# Patient Record
Sex: Female | Born: 1945 | Race: White | Hispanic: No | Smoking: Former smoker
Health system: Southern US, Community
[De-identification: ages and names within clinical notes are randomized; demographics above are authoritative.]

## PROBLEM LIST (undated history)

## (undated) DIAGNOSIS — N390 Urinary tract infection, site not specified: Secondary | ICD-10-CM

## (undated) DIAGNOSIS — K5792 Diverticulitis of intestine, part unspecified, without perforation or abscess without bleeding: Secondary | ICD-10-CM

## (undated) DIAGNOSIS — F419 Anxiety disorder, unspecified: Secondary | ICD-10-CM

## (undated) HISTORY — PX: CHOLECYSTECTOMY: SHX55

---

## 2008-04-12 ENCOUNTER — Other Ambulatory Visit: Payer: Self-pay

## 2008-04-12 ENCOUNTER — Emergency Department: Payer: Self-pay | Admitting: Emergency Medicine

## 2013-09-08 ENCOUNTER — Emergency Department: Payer: Self-pay | Admitting: Emergency Medicine

## 2013-10-01 ENCOUNTER — Emergency Department: Payer: Self-pay | Admitting: Emergency Medicine

## 2013-10-01 LAB — CBC
MCH: 30.2 pg (ref 26.0–34.0)
Platelet: 439 10*3/uL (ref 150–440)
RBC: 4.58 10*6/uL (ref 3.80–5.20)

## 2013-10-01 LAB — COMPREHENSIVE METABOLIC PANEL
Alkaline Phosphatase: 90 U/L (ref 50–136)
BUN: 10 mg/dL (ref 7–18)
Bilirubin,Total: 0.2 mg/dL (ref 0.2–1.0)
Calcium, Total: 10 mg/dL (ref 8.5–10.1)
Chloride: 104 mmol/L (ref 98–107)
Creatinine: 0.77 mg/dL (ref 0.60–1.30)
EGFR (African American): >60
Potassium: 3.8 mmol/L (ref 3.5–5.1)
Total Protein: 8 g/dL (ref 6.4–8.2)

## 2013-10-01 LAB — URINALYSIS, COMPLETE
Bilirubin,UR: NEGATIVE
Blood: NEGATIVE
Leukocyte Esterase: NEGATIVE
Protein: NEGATIVE
Specific Gravity: 1.002 (ref 1.003–1.030)
WBC UR: NONE SEEN /HPF (ref 0–5)

## 2013-10-02 LAB — LIPASE, BLOOD: Lipase: 220 U/L (ref 73–393)

## 2019-01-16 ENCOUNTER — Ambulatory Visit
Admission: EM | Admit: 2019-01-16 | Discharge: 2019-01-16 | Disposition: A | Discharge status: Home / Self Care | Payer: Medicare HMO | Attending: Family Medicine | Admitting: Family Medicine

## 2019-01-16 ENCOUNTER — Encounter: Payer: Self-pay | Admitting: Emergency Medicine

## 2019-01-16 ENCOUNTER — Other Ambulatory Visit: Payer: Self-pay

## 2019-01-16 DIAGNOSIS — F419 Anxiety disorder, unspecified: Secondary | ICD-10-CM

## 2019-01-16 DIAGNOSIS — Z87891 Personal history of nicotine dependence: Secondary | ICD-10-CM | POA: Diagnosis not present

## 2019-01-16 DIAGNOSIS — E441 Mild protein-calorie malnutrition: Secondary | ICD-10-CM

## 2019-01-16 NOTE — Discharge Instructions (Addendum)
Supplement  your nutrition with boost or Ensure 3 cans daily.  Follow-up with your primary care physician as soon as possible.

## 2019-01-16 NOTE — ED Provider Notes (Signed)
[facility]    CSN: [id number] Arrival date & time: 01/16/19  1840     History   Chief Complaint Chief Complaint  Patient presents with  . Nausea  . Anxiety    HPI Chloe Murray is a 73 y.o. female.   HPI  73 year old female appears very frail and cachectic presents with nausea and anxiety has been going on for a couple of days.  She states she feels extremely weak.  Been seen at the 2 urgent cares over the past 2 weeks diagnosed with sinus infection treated with doxycycline also treated for nausea with Zofran which she states caused her belly to become very distended and bloated.  He was also given additional 15 Xantac because of anxiety.  That according to the [place] substance abuse registry was 105 total this month.  Her main complaints tonight are that of weakness and also of nausea.  States that she eats but is not very hungry and she is trying her best to have fluid intake.  She does not offer any abdominal pain or vomiting.  Appear depressed although she does not believe that that is a problem even though she lost her husband just a year ago.        History reviewed. No pertinent past medical history.  There are no active problems to display for this patient.   History reviewed. No pertinent surgical history.  OB History   No obstetric history on file.      Home Medications    Prior to Admission medications   Medication Sig Start Date End Date Taking? Authorizing Provider  ALPRAZolam Prudy Feeler) 0.25 MG tablet Take by mouth. 01/11/19  Yes [provider]    Family History History reviewed. No pertinent family history.  Social History Social History   Tobacco Use  . Smoking status: Former Games developer  . Smokeless tobacco: Never Used  Substance Use Topics  . Alcohol use: Never    Frequency: Never  . Drug use: Never     Allergies   Citalopram; Sertraline; and Penicillins   Review of Systems Review of Systems    Constitutional: Positive for activity change and appetite change. Negative for chills, fatigue and fever.  Gastrointestinal: Positive for nausea.  Psychiatric/Behavioral: The patient is nervous/anxious.   All other systems reviewed and are negative.    Physical Exam Triage Vital Signs ED Triage Vitals  Enc Vitals Group     BP 01/16/19 1901 129/70     Pulse Rate 01/16/19 1901 77     Resp 01/16/19 1901 16     Temp 01/16/19 1901 97.9 F (36.6 C)     Temp Source 01/16/19 1901 Oral     SpO2 01/16/19 1901 100 %     Weight 01/16/19 1858 100 lb (45.4 kg)     Height 01/16/19 1858 5\' 3"  (1.6 m)     Head Circumference --      Peak Flow --      Pain Score 01/16/19 1858 5     Pain Loc --      Pain Edu? --      Excl. in GC? --    No data found.  Updated Vital Signs BP 129/70 (BP Location: Left Arm)   Pulse 77   Temp 97.9 F (36.6 C) (Oral)   Resp 16   Ht 5\' 3"  (1.6 m)   Wt 100 lb (45.4 kg)   SpO2 100%   BMI 17.71 kg/m   Visual Acuity Right Eye  Distance:   Left Eye Distance:   Bilateral Distance:    Right Eye Near:   Left Eye Near:    Bilateral Near:     Physical Exam Vitals signs and nursing note reviewed.  Constitutional:      General: She is not in acute distress.    Appearance: Normal appearance. She is normal weight. She is not ill-appearing, toxic-appearing or diaphoretic.  HENT:     Head: Normocephalic.     Right Ear: Tympanic membrane, ear canal and external ear normal.     Left Ear: Tympanic membrane, ear canal and external ear normal.     Nose: Nose normal.     Mouth/Throat:     Mouth: Mucous membranes are moist.     Pharynx: Oropharynx is clear. No oropharyngeal exudate or posterior oropharyngeal erythema.  Eyes:     General:        Right eye: No discharge.        Left eye: No discharge.     Conjunctiva/sclera: Conjunctivae normal.  Neck:     Musculoskeletal: Normal range of motion and neck supple.  Pulmonary:     Effort: Pulmonary effort is  normal.     Breath sounds: Normal breath sounds.  Abdominal:     General: Bowel sounds are normal. There is no distension.     Palpations: Abdomen is soft.     Tenderness: There is no abdominal tenderness. There is no guarding or rebound.  Musculoskeletal: Normal range of motion.  Lymphadenopathy:     Cervical: No cervical adenopathy.  Skin:    General: Skin is warm and dry.  Neurological:     General: No focal deficit present.     Mental Status: She is alert and oriented to person, place, and time.  Psychiatric:        Mood and Affect: Mood normal.        Behavior: Behavior normal.        Thought Content: Thought content normal.        Judgment: Judgment normal.      UC Treatments / Results  Labs (all labs ordered are listed, but only abnormal results are displayed) Labs Reviewed - No data to display  EKG None  Radiology No results found.  Procedures Procedures (including critical care time)  Medications Ordered in UC Medications - No data to display  Initial Impression / Assessment and Plan / UC Course  I have reviewed the triage vital signs and the nursing notes.  Pertinent labs & imaging results that were available during my care of the patient were reviewed by me and considered in my medical decision making (see chart for details).   I reviewed the visits at [facility] with her, care that she was given so far and her current problems of weakness and nausea.  Examination today is reassuring as is the laboratory performed at the urgent cares.  Believe the patient has an element of depression also believe that she is calorie and protein malnourished.  Spite the intake that she has recorded along with the fluid intake still is not enough to help her calorie needs.  I recommended that she start drinking Ensure or boost 3 cans daily and supplement in addition to her regular intake of food.  Should also follow-up with her primary care physician as soon as possible for further  evaluation and work-up.  Has a prescription for Zofran but she does have side effects from that.  I am not going to prescribe  any other further antinausea medication this point and hope that by increasing her intake that will solve that problem itself.  She worsens or is not improving and is unable to see her regular physician she should go to the emergency room for a more thorough work-up.   Final Clinical Impressions(s) / UC Diagnoses   Final diagnoses:  Anxiety  Mild protein-calorie malnutrition [facility](HCC)     Discharge Instructions     Supplement  your nutrition with boost or Ensure 3 cans daily.  Follow-up with your primary care physician as soon as possible.    ED Prescriptions    None     Controlled Substance Prescriptions [place] Controlled Substance Registry consulted? Not Applicable   Lutricia FeilRoemer, Analyssa Downs P, PA-C 01/16/19 2103

## 2019-01-16 NOTE — ED Triage Notes (Signed)
Patient c/o nausea and anxiety that has been going on for couple of days.  Patient states that she feels week.  Patient states that she has been seen at 2 UC over the past 2 weeks.  Patient has been on antibiotic and prednisone.

## 2019-01-18 ENCOUNTER — Emergency Department
Admission: EM | Admit: 2019-01-18 | Discharge: 2019-01-18 | Disposition: A | Discharge status: Home / Self Care | Payer: Medicare HMO | Attending: Emergency Medicine | Admitting: Emergency Medicine

## 2019-01-18 ENCOUNTER — Emergency Department: Payer: Medicare HMO

## 2019-01-18 ENCOUNTER — Other Ambulatory Visit: Payer: Self-pay

## 2019-01-18 ENCOUNTER — Encounter: Payer: Self-pay | Admitting: Emergency Medicine

## 2019-01-18 DIAGNOSIS — E860 Dehydration: Secondary | ICD-10-CM | POA: Diagnosis not present

## 2019-01-18 DIAGNOSIS — R1013 Epigastric pain: Secondary | ICD-10-CM

## 2019-01-18 DIAGNOSIS — Z87891 Personal history of nicotine dependence: Secondary | ICD-10-CM | POA: Diagnosis not present

## 2019-01-18 HISTORY — DX: Anxiety disorder, unspecified: F41.9

## 2019-01-18 LAB — URINALYSIS, COMPLETE (UACMP) WITH MICROSCOPIC
BILIRUBIN URINE: NEGATIVE
Bacteria, UA: NONE SEEN
Glucose, UA: NEGATIVE mg/dL
Ketones, ur: NEGATIVE mg/dL
Leukocytes, UA: NEGATIVE
Nitrite: NEGATIVE
Protein, ur: NEGATIVE mg/dL
Specific Gravity, Urine: 1.004 — ABNORMAL LOW (ref 1.005–1.030)
pH: 6 (ref 5.0–8.0)

## 2019-01-18 LAB — BASIC METABOLIC PANEL
Anion gap: 5 (ref 5–15)
BUN: 19 mg/dL (ref 8–23)
CALCIUM: 9.5 mg/dL (ref 8.9–10.3)
CO2: 31 mmol/L (ref 22–32)
CREATININE: 0.63 mg/dL (ref 0.44–1.00)
Chloride: 103 mmol/L (ref 98–111)
GFR calc Af Amer: >60 mL/min (ref >60)
GFR calc non Af Amer: >60 mL/min (ref >60)
Glucose, Bld: 101 mg/dL — ABNORMAL HIGH (ref 70–99)
Potassium: 4.3 mmol/L (ref 3.5–5.1)
SODIUM: 139 mmol/L (ref 135–145)

## 2019-01-18 LAB — CBC
HCT: 40.3 % (ref 36.0–46.0)
Hemoglobin: 13 g/dL (ref 12.0–15.0)
MCH: 29.9 pg (ref 26.0–34.0)
MCHC: 32.3 g/dL (ref 30.0–36.0)
MCV: 92.6 fL (ref 80.0–100.0)
PLATELETS: 398 10*3/uL (ref 150–400)
RBC: 4.35 MIL/uL (ref 3.87–5.11)
RDW: 12.6 % (ref 11.5–15.5)
WBC: 10.2 10*3/uL (ref 4.0–10.5)
nRBC: 0 % (ref 0.0–0.2)

## 2019-01-18 LAB — HEPATIC FUNCTION PANEL
ALT: 28 U/L (ref 0–44)
AST: 26 U/L (ref 15–41)
Albumin: 4.2 g/dL (ref 3.5–5.0)
Alkaline Phosphatase: 45 U/L (ref 38–126)
Total Bilirubin: 0.4 mg/dL (ref 0.3–1.2)
Total Protein: 6.8 g/dL (ref 6.5–8.1)

## 2019-01-18 LAB — GLUCOSE, CAPILLARY: Glucose-Capillary: 92 mg/dL (ref 70–99)

## 2019-01-18 LAB — TSH: TSH: 1.281 u[IU]/mL (ref 0.350–4.500)

## 2019-01-18 LAB — ACETAMINOPHEN LEVEL: Acetaminophen (Tylenol), Serum: <10 ug/mL — ABNORMAL LOW (ref 10–30)

## 2019-01-18 LAB — T4, FREE: Free T4: 1.1 ng/dL (ref 0.82–1.77)

## 2019-01-18 LAB — PHOSPHORUS: Phosphorus: 3.9 mg/dL (ref 2.5–4.6)

## 2019-01-18 LAB — MAGNESIUM: Magnesium: 2.2 mg/dL (ref 1.7–2.4)

## 2019-01-18 LAB — LIPASE, BLOOD: Lipase: 61 U/L — ABNORMAL HIGH (ref 11–51)

## 2019-01-18 MED ORDER — IOHEXOL 300 MG/ML  SOLN
75.0000 mL | Freq: Once | INTRAMUSCULAR | Status: AC | PRN
  Administered 2019-01-18: 75 mL via INTRAVENOUS

## 2019-01-18 MED ORDER — SODIUM CHLORIDE 0.9 % IV BOLUS
1000.0000 mL | Freq: Once | INTRAVENOUS | Status: AC
  Administered 2019-01-18: 1000 mL via INTRAVENOUS

## 2019-01-18 MED ORDER — FAMOTIDINE 20 MG PO TABS: 20.0000 mg | ORAL_TABLET | Freq: Two times a day (BID) | ORAL | 0 refills | Status: AC

## 2019-01-18 MED ORDER — ALUMINUM-MAGNESIUM-SIMETHICONE 200-200-20 MG/5ML PO SUSP: 30.0000 mL | Freq: Three times a day (TID) | ORAL | 0 refills | Status: DC

## 2019-01-18 NOTE — ED Triage Notes (Signed)
Pt in via POV with multiple complaints, states, "I have been sick since the first weekend of the month, I was seen at [facility] and given antibiotics and nausea medicine without any relief."  Pt reports generalized weakness, continued nausea, reports diarrhea has resolved but states she is now seeing blood from rectum.  Vitals WDL, NAD noted at this time.

## 2019-01-18 NOTE — ED Provider Notes (Signed)
[facility] Emergency Department Provider Note  ____________________________________________  Time seen: Approximately 9:19 PM  I have reviewed the triage vital signs and the nursing notes.   HISTORY  Chief Complaint Weakness    HPI Chloe Murray is a 73 y.o. female with a history of anxiety who complains of upper abdominal pain for the past month.  Has been seen at [facility] urgent care 2 or 3 times.  Initially was on a prescription of doxycycline and Zofran for URI/sinusitis, reports that her symptoms overall have not improved and she still feels weak.  She has a loss of appetite, but ate 2 meals today and drinks Gatorade.  She has epigastric pain which is nonradiating.  No aggravating or alleviating factors.  Moderate intensity.  Intermittent.      Past Medical History:  Diagnosis Date  . Anxiety      There are no active problems to display for this patient.    History reviewed. No pertinent surgical history.   Prior to Admission medications   Medication Sig Start Date End Date Taking? Authorizing Provider  ALPRAZolam Prudy Feeler) 0.25 MG tablet Take by mouth. 01/11/19   [provider]  aluminum-magnesium hydroxide-simethicone (MAALOX) 200-200-20 MG/5ML SUSP Take 30 mLs by mouth 4 (four) times daily -  before meals and at bedtime. 01/18/19   Sharman Cheek, MD  famotidine (PEPCID) 20 MG tablet Take 1 tablet (20 mg total) by mouth 2 (two) times daily. 01/18/19   Sharman Cheek, MD     Allergies Sertraline; Citalopram; and Penicillins   No family history on file.  Social History Social History   Tobacco Use  . Smoking status: Former Games developer  . Smokeless tobacco: Never Used  Substance Use Topics  . Alcohol use: Never    Frequency: Never  . Drug use: Never    Review of Systems  Constitutional:   No fever or chills.  ENT:   No sore throat. No rhinorrhea. Cardiovascular:   No chest pain or syncope. Respiratory:   No dyspnea or  cough. Gastrointestinal:   Positive as above for abdominal pain without vomiting constipation and diarrhea.  Musculoskeletal:   Negative for focal pain or swelling All other systems reviewed and are negative except as documented above in ROS and HPI.  ____________________________________________   PHYSICAL EXAM:  VITAL SIGNS: ED Triage Vitals  Enc Vitals Group     BP 01/18/19 1653 120/63     Pulse Rate 01/18/19 1653 97     Resp 01/18/19 1653 (!) 21     Temp 01/18/19 1653 (!) 97.5 F (36.4 C)     Temp Source 01/18/19 1653 Oral     SpO2 01/18/19 1653 100 %     Weight 01/18/19 1654 100 lb (45.4 kg)     Height 01/18/19 1654 5\' 3"  (1.6 m)     Head Circumference --      Peak Flow --      Pain Score 01/18/19 1653 6     Pain Loc --      Pain Edu? --      Excl. in GC? --     Vital signs reviewed, nursing assessments reviewed.   Constitutional:   Alert and oriented. Non-toxic appearance. Eyes:   Conjunctivae are normal. EOMI. PERRL. ENT      Head:   Normocephalic and atraumatic.      Nose:   No congestion/rhinnorhea.       Mouth/Throat:   MMM, no pharyngeal erythema. No peritonsillar mass.  Neck:   No meningismus. Full ROM. Hematological/Lymphatic/Immunilogical:   No cervical lymphadenopathy. Cardiovascular:   RRR. Symmetric bilateral radial and DP pulses.  No murmurs. Cap refill less than 2 seconds. Respiratory:   Normal respiratory effort without tachypnea/retractions. Breath sounds are clear and equal bilaterally. No wheezes/rales/rhonchi. Gastrointestinal:   Soft with mild epigastric tenderness. Non distended. There is no CVA tenderness.  No rebound, rigidity, or guarding. Musculoskeletal:   Normal range of motion in all extremities. No joint effusions.  No lower extremity tenderness.  No edema. Neurologic:   Normal speech and language.  Motor grossly intact. No acute focal neurologic deficits are appreciated.  Skin:    Skin is warm, dry and intact. No rash noted.  No  petechiae, purpura, or bullae.  ____________________________________________    LABS (pertinent positives/negatives) (all labs ordered are listed, but only abnormal results are displayed) Labs Reviewed  BASIC METABOLIC PANEL - Abnormal; Notable for the following components:      Result Value   Glucose, Bld 101 (*)    All other components within normal limits  URINALYSIS, COMPLETE (UACMP) WITH MICROSCOPIC - Abnormal; Notable for the following components:   Color, Urine COLORLESS (*)    APPearance CLEAR (*)    Specific Gravity, Urine 1.004 (*)    Hgb urine dipstick SMALL (*)    All other components within normal limits  LIPASE, BLOOD - Abnormal; Notable for the following components:   Lipase 61 (*)    All other components within normal limits  ACETAMINOPHEN LEVEL - Abnormal; Notable for the following components:   Acetaminophen (Tylenol), Serum <10 (*)    All other components within normal limits  CBC  GLUCOSE, CAPILLARY  HEPATIC FUNCTION PANEL  MAGNESIUM  PHOSPHORUS  TSH  T4, FREE  CBG MONITORING, ED   ____________________________________________   EKG    ____________________________________________    RADIOLOGY  Ct Abdomen Pelvis W Contrast  Result Date: 01/18/2019 CLINICAL DATA:  73 year old female with acute abdominal and pelvic pain with nausea. EXAM: CT ABDOMEN AND PELVIS WITH CONTRAST TECHNIQUE: Multidetector CT imaging of the abdomen and pelvis was performed using the standard protocol following bolus administration of intravenous contrast. CONTRAST:  75mL OMNIPAQUE IOHEXOL 300 MG/ML  SOLN COMPARISON:  10/02/2013 CT FINDINGS: Lower chest: No acute abnormality Hepatobiliary: The liver is unremarkable. The patient is status post cholecystectomy. No biliary dilatation. Pancreas: Unremarkable Spleen: Unremarkable Adrenals/Urinary Tract: Kidneys, adrenal glands and bladder are unremarkable. Stomach/Bowel: Stomach is within normal limits. No evidence of bowel wall  thickening, distention, or inflammatory changes. Colonic diverticulosis noted without evidence of diverticulitis. Vascular/Lymphatic: Aortic atherosclerosis. No enlarged abdominal or pelvic lymph nodes. Reproductive: Status post hysterectomy. No adnexal masses. Other: No ascites, focal collection/abscess or pneumoperitoneum. Musculoskeletal: No acute or suspicious bony abnormalities. IMPRESSION: 1. No evidence of acute abnormality 2.  Aortic Atherosclerosis (ICD10-I70.0). Electronically Signed   By: Harmon PierJeffrey  Hu M.D.   On: 01/18/2019 19:09    ____________________________________________   PROCEDURES Procedures  ____________________________________________  DIFFERENTIAL DIAGNOSIS   Pancreatitis, cholecystitis, gastritis, bowel perforation, AAA, colitis/IBD  CLINICAL IMPRESSION / ASSESSMENT AND PLAN / ED COURSE  Pertinent labs & imaging results that were available during my care of the patient were reviewed by me and considered in my medical decision making (see chart for details).    Patient is nontoxic, presents with subacute to chronic upper abdominal pain.  Appears mildly dehydrated on exam.  Vital signs are unremarkable.  Labs including LFTs and TFTs are unremarkable.  CT scan obtained due to age  and chronicity, which was unremarkable.  Patient sitting upright and ambulatory, eager to go home so she can take her Xanax.  Recommended she do a trial of antacid therapy and follow-up with her doctor within a week.  Return precautions for worsening symptoms.      ____________________________________________   FINAL CLINICAL IMPRESSION(S) / ED DIAGNOSES    Final diagnoses:  Epigastric pain  Mild dehydration     ED Discharge Orders         Ordered    famotidine (PEPCID) 20 MG tablet  2 times daily     01/18/19 2118    aluminum-magnesium hydroxide-simethicone (MAALOX) 200-200-20 MG/5ML SUSP  3 times daily before meals & bedtime     01/18/19 2118          Portions of this  note were generated with dragon dictation software. Dictation errors may occur despite best attempts at proofreading.   Sharman CheekStafford, Jailani Hogans, MD 01/18/19 2122

## 2019-01-18 NOTE — Discharge Instructions (Signed)
Your CT scan and lab test today were all okay.  Please take medicines to calm your stomach, drink lots of fluids to stay hydrated, and follow-up with your doctor within a week.

## 2019-01-20 ENCOUNTER — Emergency Department: Payer: Medicare HMO

## 2019-01-20 ENCOUNTER — Other Ambulatory Visit: Payer: Self-pay

## 2019-01-20 ENCOUNTER — Emergency Department
Admission: EM | Admit: 2019-01-20 | Discharge: 2019-01-20 | Disposition: A | Discharge status: Home / Self Care | Payer: Medicare HMO | Attending: Emergency Medicine | Admitting: Emergency Medicine

## 2019-01-20 ENCOUNTER — Encounter: Payer: Self-pay | Admitting: Emergency Medicine

## 2019-01-20 DIAGNOSIS — J069 Acute upper respiratory infection, unspecified: Secondary | ICD-10-CM | POA: Diagnosis not present

## 2019-01-20 DIAGNOSIS — Z87891 Personal history of nicotine dependence: Secondary | ICD-10-CM | POA: Diagnosis not present

## 2019-01-20 DIAGNOSIS — F419 Anxiety disorder, unspecified: Secondary | ICD-10-CM | POA: Diagnosis not present

## 2019-01-20 DIAGNOSIS — J3489 Other specified disorders of nose and nasal sinuses: Secondary | ICD-10-CM | POA: Diagnosis present

## 2019-01-20 LAB — CBC
HCT: 39.9 % (ref 36.0–46.0)
Hemoglobin: 12.9 g/dL (ref 12.0–15.0)
MCH: 29.8 pg (ref 26.0–34.0)
MCHC: 32.3 g/dL (ref 30.0–36.0)
MCV: 92.1 fL (ref 80.0–100.0)
Platelets: 402 10*3/uL — ABNORMAL HIGH (ref 150–400)
RBC: 4.33 MIL/uL (ref 3.87–5.11)
RDW: 12.7 % (ref 11.5–15.5)
WBC: 11.5 10*3/uL — ABNORMAL HIGH (ref 4.0–10.5)
nRBC: 0 % (ref 0.0–0.2)

## 2019-01-20 LAB — COMPREHENSIVE METABOLIC PANEL
ALT: 32 U/L (ref 0–44)
AST: 28 U/L (ref 15–41)
Albumin: 4 g/dL (ref 3.5–5.0)
Alkaline Phosphatase: 40 U/L (ref 38–126)
Anion gap: 7 (ref 5–15)
BUN: 15 mg/dL (ref 8–23)
CO2: 29 mmol/L (ref 22–32)
Calcium: 9.1 mg/dL (ref 8.9–10.3)
Chloride: 101 mmol/L (ref 98–111)
Creatinine, Ser: 0.67 mg/dL (ref 0.44–1.00)
GFR calc Af Amer: >60 mL/min (ref >60)
GFR calc non Af Amer: >60 mL/min (ref >60)
Glucose, Bld: 86 mg/dL (ref 70–99)
POTASSIUM: 4 mmol/L (ref 3.5–5.1)
Sodium: 137 mmol/L (ref 135–145)
Total Bilirubin: 0.4 mg/dL (ref 0.3–1.2)
Total Protein: 7.2 g/dL (ref 6.5–8.1)

## 2019-01-20 LAB — LIPASE, BLOOD: Lipase: 51 U/L (ref 11–51)

## 2019-01-20 MED ORDER — SODIUM CHLORIDE 0.9% FLUSH: 3.0000 mL | Freq: Once | INTRAVENOUS | Status: DC

## 2019-01-20 MED ORDER — LACTINEX PO CHEW: 1.0000 | CHEWABLE_TABLET | Freq: Three times a day (TID) | ORAL | 0 refills | Status: AC

## 2019-01-20 MED ORDER — FLUTICASONE PROPIONATE 50 MCG/ACT NA SUSP: 2.0000 | Freq: Every day | NASAL | 0 refills | Status: DC

## 2019-01-20 MED ORDER — PSEUDOEPHEDRINE HCL 60 MG PO TABS: 60.0000 mg | ORAL_TABLET | Freq: Four times a day (QID) | ORAL | 0 refills | Status: DC | PRN

## 2019-01-20 MED ORDER — SODIUM CHLORIDE 0.9 % IV BOLUS
500.0000 mL | Freq: Once | INTRAVENOUS | Status: AC
  Administered 2019-01-20: 500 mL via INTRAVENOUS

## 2019-01-20 NOTE — ED Notes (Signed)
Patient ambulated to room commode with a steady gait. Patient transported to CT scan.

## 2019-01-20 NOTE — ED Triage Notes (Signed)
Pt to ED via POV. Pt states that she was seen 2 days ago for diarrhea and was told to follow up at [facility]. Pt states that she has still been having diarrhea. She went to [facility] this morning and they sent her back here for possible dehydration. Pt is in NAD at this time.

## 2019-01-20 NOTE — ED Notes (Signed)
Patient is concerned that her sinus infection and right ear infection is coming back and causing her to feel weak. Patient states diarrhea is resolving with Pepto-bismol.

## 2019-01-20 NOTE — Discharge Instructions (Addendum)
Take the nasal spray and decongestant over the next several days as prescribed to help with your symptoms.  You can continue the Pepcid and other medications you have been taking for your GI symptoms, and you should start taking the lactobacillus as well to help regrow good bacteria in your intestine.  Return to the ER for new or worsening congestion, ear pain, lightheadedness, weakness, vomiting or diarrhea, or any other new or worsening symptoms that concern you.  Make an appointment to follow-up with your primary care doctor as soon as possible.  We will fax him a note letting him know you were here.

## 2019-01-20 NOTE — ED Provider Notes (Signed)
[facility] Emergency Department Provider Note ____________________________________________   First MD Initiated Contact with Patient 01/20/19 1531     (approximate)  I have reviewed the triage vital signs and the nursing notes.   HISTORY  Chief Complaint Diarrhea    HPI Chloe Murray is a 73 y.o. female with PMH as noted below who presents with 2 primary complaints.  She states that she has persistent sinus pressure, right ear pain, and congestion over the last several weeks associated with generalized weakness and malaise.  She also reports some diarrhea over the last several days and states she went to urgent care and was told to come to the hospital because of concern that she could be dehydrated.  The patient denies any fever, vomiting, focal abdominal pain, or urinary symptoms.  She was [facility] times and got a course of prednisone as well as a course of doxycycline which improved the symptoms somewhat.  She was also seen here in the ED 2 days ago.  Past Medical History:  Diagnosis Date  . Anxiety     There are no active problems to display for this patient.   History reviewed. No pertinent surgical history.  Prior to Admission medications   Medication Sig Start Date End Date Taking? Authorizing Provider  ALPRAZolam Prudy Feeler(XANAX) 0.25 MG tablet Take by mouth. 01/11/19   [provider]  aluminum-magnesium hydroxide-simethicone (MAALOX) 200-200-20 MG/5ML SUSP Take 30 mLs by mouth 4 (four) times daily -  before meals and at bedtime. 01/18/19   Sharman CheekStafford, Phillip, MD  famotidine (PEPCID) 20 MG tablet Take 1 tablet (20 mg total) by mouth 2 (two) times daily. 01/18/19   Sharman CheekStafford, Phillip, MD  fluticasone [facility](FLONASE) 50 MCG/ACT nasal spray Place 2 sprays into both nostrils daily. 01/20/19   Dionne BucySiadecki, Lacretia Tindall, MD  lactobacillus acidophilus & bulgar (LACTINEX) chewable tablet Chew 1 tablet by mouth 3 (three) times daily with  meals for 7 days. 01/20/19 01/27/19  Dionne BucySiadecki, Elden Brucato, MD  pseudoephedrine (SUDAFED) 60 MG tablet Take 1 tablet (60 mg total) by mouth every 6 (six) hours as needed for congestion. 01/20/19   Dionne BucySiadecki, Tahra Hitzeman, MD    Allergies Sertraline; Citalopram; and Penicillins  No family history on file.  Social History Social History   Tobacco Use  . Smoking status: Former Games developermoker  . Smokeless tobacco: Never Used  Substance Use Topics  . Alcohol use: Never    Frequency: Never  . Drug use: Never    Review of Systems  Constitutional: No fever. Eyes: No visual changes. ENT: Positive for congestion and sinus pressure. Cardiovascular: Denies chest pain. Respiratory: Denies shortness of breath. Gastrointestinal: Positive for diarrhea. Genitourinary: Negative for dysuria.  Musculoskeletal: Negative for back pain. Skin: Negative for rash. Neurological: Negative for headache.  ____________________________________________   PHYSICAL EXAM:  VITAL SIGNS: ED Triage Vitals  Enc Vitals Group     BP 01/20/19 1253 (!) 124/49     Pulse Rate 01/20/19 1253 68     Resp 01/20/19 1253 16     Temp 01/20/19 1253 98.1 F (36.7 C)     Temp Source 01/20/19 1253 Oral     SpO2 01/20/19 1253 99 %     Weight 01/20/19 1251 101 lb 12.8 oz (46.2 kg)     Height 01/20/19 1251 5\' 3"  (1.6 m)     Head Circumference --      Peak Flow --      Pain Score 01/20/19 1250 4  Pain Loc --      Pain Edu? --      Excl. in GC? --     Constitutional: Alert and oriented.  Relatively well appearing and in no acute distress. Eyes: Conjunctivae are normal.  EOMI.  PERRLA. Head: Atraumatic. Nose: No congestion/rhinnorhea. Mouth/Throat: Mucous membranes are somewhat dry.   Neck: Normal range of motion.  Cardiovascular: Good peripheral circulation. Respiratory: Normal respiratory effort. Lungs CTAB. Gastrointestinal: Soft and nontender. No distention.  Musculoskeletal: Extremities warm and well perfused.    Neurologic:  Normal speech and language. No gross focal neurologic deficits are appreciated.  Skin:  Skin is warm and dry. No rash noted. Psychiatric: Mood and affect are normal. Speech and behavior are normal.  ____________________________________________   LABS (all labs ordered are listed, but only abnormal results are displayed)  Labs Reviewed  CBC - Abnormal; Notable for the following components:      Result Value   WBC 11.5 (*)    Platelets 402 (*)    All other components within normal limits  LIPASE, BLOOD  COMPREHENSIVE METABOLIC PANEL   ____________________________________________  EKG   ____________________________________________  RADIOLOGY  CT maxillofacial: No evidence of sinusitis  ____________________________________________   PROCEDURES  Procedure(s) performed: No  Procedures  Critical Care performed: No ____________________________________________   INITIAL IMPRESSION / ASSESSMENT AND PLAN / ED COURSE  Pertinent labs & imaging results that were available during my care of the patient were reviewed by me and considered in my medical decision making (see chart for details).  73 year old female with PMH as noted above presents with persistent sinus pressure, congestion, and right ear discomfort over several weeks, as well as with diarrhea and concerned that she could be dehydrated.  Incidentally, the patient also mentions that she has been feeling depressed.  She states that she used to be on antidepressants but has not been on one for some time.  She denies any SI or HI, or feelings of hopelessness.  She states that she does not need to speak to a psychiatrist today but just wanted to let me know because a doctor had asked her about this previously and wanted to make sure I knew any relevant information.  I reviewed the past medical records in Epic.  The patient was originally seen in [facility] urgent care on 1/16 with URI and sinusitis type symptoms as  well as diarrhea and was started on doxycycline and Zofran.  She was negative for influenza and C. difficile.  She returned on 1/23 with fatigue and nausea.  She was given a course of prednisone.  The patient was then seen in our urgent care on 1/28 with concern for persistent weakness and was started on Ensure to help her caloric intake.  She was then seen in the ED 2 days ago and had CT abdomen which was negative.  Overall I suspect that the patient most likely has a viral upper respiratory infection or sinusitis which precipitated IBS exacerbation and likely some symptoms related to the antibiotic course.  I would like to minimize additional medications.  Lab work-up is reassuring.  The patient does appear mildly dehydrated clinically.  I will give a fluid bolus, and obtain a CT of the sinuses.  If this is negative I will recommend symptomatic treatment only.  If she does have evidence of sinusitis with symptoms having gone on for several weeks I may consider an alternate antibiotic versus outpatient ENT follow-up.  ----------------------------------------- 6:51 PM on 01/20/2019 -----------------------------------------  CT was negative  for evidence of sinusitis.  Therefore, I will prescribe the patient a nasal spray and decongestant for symptomatic treatment.  I will also prescribe lactobacillus for her GI symptoms after having been on an antibiotic.  The patient feels comfortable and would like to go home.  I counseled her on the results of the work-up and the plan of care, and she expresses agreement.  Return precautions given and she expressed understanding.  In terms of the depression, there is no evidence of danger to self or others and no indication for emergent psychiatric consultation in the ED.  The patient states she does not need to speak to a psychiatrist right now.  She would like to follow-up with her primary care doctor for  this. ____________________________________________   FINAL CLINICAL IMPRESSION(S) / ED DIAGNOSES  Final diagnoses:  Viral upper respiratory illness      NEW MEDICATIONS STARTED DURING THIS VISIT:  New Prescriptions   FLUTICASONE (FLONASE) 50 MCG/ACT NASAL SPRAY    Place 2 sprays into both nostrils daily.   LACTOBACILLUS ACIDOPHILUS & BULGAR (LACTINEX) CHEWABLE TABLET    Chew 1 tablet by mouth 3 (three) times daily with meals for 7 days.   PSEUDOEPHEDRINE (SUDAFED) 60 MG TABLET    Take 1 tablet (60 mg total) by mouth every 6 (six) hours as needed for congestion.     Note:  This document was prepared using Dragon voice recognition software and may include unintentional dictation errors.    Dionne BucySiadecki, Leata Dominy, MD 01/20/19 [phone]

## 2020-07-06 ENCOUNTER — Other Ambulatory Visit: Payer: Self-pay

## 2020-07-06 ENCOUNTER — Emergency Department: Payer: Medicare HMO

## 2020-07-06 ENCOUNTER — Emergency Department
Admission: EM | Admit: 2020-07-06 | Discharge: 2020-07-06 | Disposition: A | Discharge status: Home / Self Care | Payer: Medicare HMO | Attending: Emergency Medicine | Admitting: Emergency Medicine

## 2020-07-06 DIAGNOSIS — K2100 Gastro-esophageal reflux disease with esophagitis, without bleeding: Secondary | ICD-10-CM | POA: Diagnosis not present

## 2020-07-06 DIAGNOSIS — Z87891 Personal history of nicotine dependence: Secondary | ICD-10-CM | POA: Insufficient documentation

## 2020-07-06 DIAGNOSIS — F419 Anxiety disorder, unspecified: Secondary | ICD-10-CM

## 2020-07-06 DIAGNOSIS — R1013 Epigastric pain: Secondary | ICD-10-CM | POA: Diagnosis present

## 2020-07-06 DIAGNOSIS — R079 Chest pain, unspecified: Secondary | ICD-10-CM

## 2020-07-06 LAB — BASIC METABOLIC PANEL
Anion gap: 7 (ref 5–15)
BUN: 14 mg/dL (ref 8–23)
CO2: 27 mmol/L (ref 22–32)
Calcium: 8.8 mg/dL — ABNORMAL LOW (ref 8.9–10.3)
Chloride: 105 mmol/L (ref 98–111)
Creatinine, Ser: 0.8 mg/dL (ref 0.44–1.00)
GFR calc Af Amer: >60 mL/min (ref >60)
GFR calc non Af Amer: >60 mL/min (ref >60)
Glucose, Bld: 118 mg/dL — ABNORMAL HIGH (ref 70–99)
Potassium: 3.1 mmol/L — ABNORMAL LOW (ref 3.5–5.1)
Sodium: 139 mmol/L (ref 135–145)

## 2020-07-06 LAB — TROPONIN I (HIGH SENSITIVITY)
Troponin I (High Sensitivity): 4 ng/L (ref <18)
Troponin I (High Sensitivity): 3 ng/L (ref <18)

## 2020-07-06 LAB — CBC
HCT: 38.2 % (ref 36.0–46.0)
Hemoglobin: 12.5 g/dL (ref 12.0–15.0)
MCH: 29.1 pg (ref 26.0–34.0)
MCHC: 32.7 g/dL (ref 30.0–36.0)
MCV: 88.8 fL (ref 80.0–100.0)
Platelets: 348 10*3/uL (ref 150–400)
RBC: 4.3 MIL/uL (ref 3.87–5.11)
RDW: 13.2 % (ref 11.5–15.5)
WBC: 13 10*3/uL — ABNORMAL HIGH (ref 4.0–10.5)
nRBC: 0 % (ref 0.0–0.2)

## 2020-07-06 MED ORDER — PANTOPRAZOLE SODIUM 40 MG PO TBEC
40.0000 mg | DELAYED_RELEASE_TABLET | Freq: Every day | ORAL | Status: DC
  Administered 2020-07-06: 40 mg via ORAL
  Filled 2020-07-06: qty 1

## 2020-07-06 MED ORDER — OMEPRAZOLE 10 MG PO CPDR: 10.0000 mg | DELAYED_RELEASE_CAPSULE | Freq: Every day | ORAL | 3 refills | Status: DC

## 2020-07-06 MED ORDER — ALUM & MAG HYDROXIDE-SIMETH 200-200-20 MG/5ML PO SUSP
30.0000 mL | Freq: Once | ORAL | Status: AC
  Administered 2020-07-06: 30 mL via ORAL
  Filled 2020-07-06: qty 30

## 2020-07-06 MED ORDER — SODIUM CHLORIDE 0.9% FLUSH: 3.0000 mL | Freq: Once | INTRAVENOUS | Status: DC

## 2020-07-06 MED ORDER — LIDOCAINE VISCOUS HCL 2 % MT SOLN
15.0000 mL | Freq: Once | OROMUCOSAL | Status: AC
  Administered 2020-07-06: 15 mL via ORAL
  Filled 2020-07-06: qty 15

## 2020-07-06 NOTE — ED Triage Notes (Signed)
Pt comes via POV from home with c/o diarrhea and belly pain for the last few days. Pt states that today she started to have left sided CP. Pt states she wasn't sure if she was having a panic attack so she took her prescribed medication.  Pt states pain has subsided some. Pt states pain radiates to back.

## 2020-07-06 NOTE — ED Provider Notes (Signed)
[facility] Emergency Department Provider Note  ____________________________________________  Time seen: Approximately 6:07 PM  I have reviewed the triage vital signs and the nursing notes.   HISTORY  Chief Complaint Chest Pain    HPI Chloe Murray is a 74 y.o. female who presents the emergency department complaining of epigastric and chest pain.  Patient states that she has been having normal IBS symptoms over the past several days.  She has had increased reflux symptoms as well.  Today she had pain that extended from the epigastric region into her chest.  She does have a history of anxiety and panic attacks and states that she thought symptoms may be similar.  She took her prescribed Xanax and when this did not immediately resolve symptoms she presented to the emergency department for evaluation.  No history of cardiac complaints to include arrhythmias or previous STEMI, blockages.  Patient states that while she was in the emergency department waiting to be seen the symptoms did resolve.  She states that she believes that it is likely her reflux and really would like to discuss being placed on omeprazole.  She has had no emesis, diarrhea or constipation.  No urinary complaints.         Past Medical History:  Diagnosis Date  . Anxiety     There are no problems to display for this patient.   History reviewed. No pertinent surgical history.  Prior to Admission medications   Medication Sig Start Date End Date Taking? Authorizing Provider  ALPRAZolam Prudy Feeler) 0.25 MG tablet Take by mouth. 01/11/19   [provider]  aluminum-magnesium hydroxide-simethicone (MAALOX) 200-200-20 MG/5ML SUSP Take 30 mLs by mouth 4 (four) times daily -  before meals and at bedtime. 01/18/19   Sharman Cheek, MD  famotidine (PEPCID) 20 MG tablet Take 1 tablet (20 mg total) by mouth 2 (two) times daily. 01/18/19   Sharman Cheek, MD  fluticasone [facility]) 50 MCG/ACT nasal  spray Place 2 sprays into both nostrils daily. 01/20/19   Dionne Bucy, MD  pseudoephedrine (SUDAFED) 60 MG tablet Take 1 tablet (60 mg total) by mouth every 6 (six) hours as needed for congestion. 01/20/19   Dionne Bucy, MD    Allergies Sertraline, Citalopram, and Penicillins  No family history on file.  Social History Social History   Tobacco Use  . Smoking status: Former Games developer  . Smokeless tobacco: Never Used  Vaping Use  . Vaping Use: Never used  Substance Use Topics  . Alcohol use: Never  . Drug use: Never     Review of Systems  Constitutional: No fever/chills Eyes: No visual changes. No discharge ENT: No upper respiratory complaints. Cardiovascular: Positive chest pain. Respiratory: no cough. No SOB. Gastrointestinal: Epigastric abdominal pain.  No nausea, no vomiting.  No diarrhea.  No constipation. Genitourinary: Negative for dysuria. No hematuria Musculoskeletal: Negative for musculoskeletal pain. Skin: Negative for rash, abrasions, lacerations, ecchymosis. Neurological: Negative for headaches, focal weakness or numbness. 10-point ROS otherwise negative.  ____________________________________________   PHYSICAL EXAM:  VITAL SIGNS: ED Triage Vitals  Enc Vitals Group     BP 07/06/20 1222 (!) 127/50     Pulse Rate 07/06/20 1222 77     Resp 07/06/20 1222 18     Temp 07/06/20 1222 97.9 F (36.6 C)     Temp Source 07/06/20 1708 Oral     SpO2 07/06/20 1222 98 %     Weight 07/06/20 1222 115 lb (52.2 kg)     Height 07/06/20 1222  5\' 3"  (1.6 m)     Head Circumference --      Peak Flow --      Pain Score 07/06/20 1221 7     Pain Loc --      Pain Edu? --      Excl. in GC? --      Constitutional: Alert and oriented. Well appearing and in no acute distress. Eyes: Conjunctivae are normal. PERRL. EOMI. Head: Atraumatic. ENT:      Ears:       Nose: No congestion/rhinnorhea.      Mouth/Throat: Mucous membranes are moist.  Neck: No stridor.   Hematological/Lymphatic/Immunilogical: No cervical lymphadenopathy. Cardiovascular: Normal rate, regular rhythm. Normal S1 and S2.  No murmurs, rubs, gallops.  No apical heave.  Good peripheral circulation. Respiratory: Normal respiratory effort without tachypnea or retractions. Lungs CTAB. Good air entry to the bases with no decreased or absent breath sounds. Gastrointestinal: Bowel sounds 4 quadrants. Soft and nontender to palpation. No guarding or rigidity. No palpable masses. No distention. No CVA tenderness. Musculoskeletal: Full range of motion to all extremities. No gross deformities appreciated. Neurologic:  Normal speech and language. No gross focal neurologic deficits are appreciated.  Skin:  Skin is warm, dry and intact. No rash noted. Psychiatric: Mood and affect are normal. Speech and behavior are normal. Patient exhibits appropriate insight and judgement.   ____________________________________________   LABS (all labs ordered are listed, but only abnormal results are displayed)  Labs Reviewed  BASIC METABOLIC PANEL - Abnormal; Notable for the following components:      Result Value   Potassium 3.1 (*)    Glucose, Bld 118 (*)    Calcium 8.8 (*)    All other components within normal limits  CBC - Abnormal; Notable for the following components:   WBC 13.0 (*)    All other components within normal limits  TROPONIN I (HIGH SENSITIVITY)  TROPONIN I (HIGH SENSITIVITY)   ____________________________________________  EKG  ED ECG REPORT I, 07/08/20 Mikal Blasdell,  personally viewed and interpreted this ECG.   Date: 07/06/2020  EKG Time: 1216 hrs.  Rate: 75 bpm  Rhythm: normal EKG, normal sinus rhythm, unchanged from previous tracings  Axis: Normal axis  Intervals:none  ST&T Change: No ST elevation or depression noted  Normal sinus rhythm.  No STEMI.  ____________________________________________  RADIOLOGY I personally viewed and evaluated these images as part of  my medical decision making, as well as reviewing the written report by the radiologist.  DG Chest 2 View  Result Date: 07/06/2020 CLINICAL DATA:  Abdominal pain and diarrhea for a few days. EXAM: CHEST - 2 VIEW COMPARISON:  None. FINDINGS: Lungs clear. Heart size normal. Aortic atherosclerosis. No pneumothorax or pleural fluid. No acute or focal bony abnormality. IMPRESSION: No acute disease. Aortic Atherosclerosis (ICD10-I70.0). Electronically Signed   By: 07/08/2020 M.D.   On: 07/06/2020 13:05    ____________________________________________    PROCEDURES  Procedure(s) performed:    Procedures    Medications  sodium chloride flush (NS) 0.9 % injection 3 mL (has no administration in time range)  pantoprazole (PROTONIX) EC tablet 40 mg (40 mg Oral Given 07/06/20 1908)  alum & mag hydroxide-simeth (MAALOX/MYLANTA) 200-200-20 MG/5ML suspension 30 mL (30 mLs Oral Given 07/06/20 1908)    And  lidocaine (XYLOCAINE) 2 % viscous mouth solution 15 mL (15 mLs Oral Given 07/06/20 1908)     ____________________________________________   INITIAL IMPRESSION / ASSESSMENT AND PLAN / ED COURSE  Pertinent labs &  imaging results that were available during my care of the patient were reviewed by me and considered in my medical decision making (see chart for details).  Review of the [place] CSRS was performed in accordance of the [facility] prior to dispensing any controlled drugs.           Patient's diagnosis is consistent with IBS, GERD, anxiety.  Patient presented to the emergency department complaining of epigastric abdominal pain that radiated into her chest.  She does have a history of IBS, GERD, anxiety.  She states that initially she thought this was one of her panic attacks but when she took her Xanax and the symptoms did not immediately resolve she presented to emergency department for evaluation.  No cardiac history.  Patient states that while waiting to be seen the symptoms fully resolved.   She is currently asymptomatic.  Overall exam is reassuring.  No acute findings.  I offered further work-up to include hepatic panel, lipase, CT scan.  Patient does not wish to proceed with these at this time stating that she believes it was her GERD and anxiety.  I feel that this is most likely the correct diagnosis.  I feel it is reasonable not to pursue imaging at this time given the reassuring exam.  I will place the patient on omeprazole.  I have given strict return precautions to follow-up with the emergency department for repeat symptoms of chest pain.  Patient's labs, EKG, chest x-ray were otherwise reassuring.  Follow-up with primary care as needed.. Patient is given ED precautions to return to the ED for any worsening or new symptoms.     ____________________________________________  FINAL CLINICAL IMPRESSION(S) / ED DIAGNOSES  Final diagnoses:  Gastroesophageal reflux disease with esophagitis without hemorrhage      NEW MEDICATIONS STARTED DURING THIS VISIT:  ED Discharge Orders    None          This chart was dictated using voice recognition software/Dragon. Despite best efforts to proofread, errors can occur which can change the meaning. Any change was purely unintentional.    Racheal Patches, PA-C 07/06/20 Zachery Dauer, MD 07/06/20 [phone]

## 2020-07-06 NOTE — ED Notes (Signed)
PT assisted to restroom by this RN. Pt asked about wait time. Pt informed that I cannot give her a wait time. Pt will be pulled for repeat vitals and labs after using restroom.

## 2020-09-09 LAB — COLOGUARD: COLOGUARD: NEGATIVE

## 2020-09-09 LAB — EXTERNAL GENERIC LAB PROCEDURE: COLOGUARD: NEGATIVE

## 2021-08-17 ENCOUNTER — Other Ambulatory Visit: Payer: Self-pay

## 2021-08-17 ENCOUNTER — Ambulatory Visit
Admission: EM | Admit: 2021-08-17 | Discharge: 2021-08-17 | Disposition: A | Discharge status: Home / Self Care | Payer: Medicare HMO | Attending: Physician Assistant | Admitting: Physician Assistant

## 2021-08-17 DIAGNOSIS — E861 Hypovolemia: Secondary | ICD-10-CM | POA: Diagnosis not present

## 2021-08-17 DIAGNOSIS — N390 Urinary tract infection, site not specified: Secondary | ICD-10-CM | POA: Diagnosis present

## 2021-08-17 LAB — POCT URINALYSIS DIP (DEVICE)
Bilirubin Urine: NEGATIVE
Glucose, UA: NEGATIVE mg/dL
Ketones, ur: NEGATIVE mg/dL
Nitrite: NEGATIVE
Protein, ur: NEGATIVE mg/dL
Specific Gravity, Urine: >=1.03 (ref 1.005–1.030)
Urobilinogen, UA: 0.2 mg/dL (ref 0.0–1.0)
pH: 5.5 (ref 5.0–8.0)

## 2021-08-17 LAB — INFLUENZA A AND B ANTIGEN (CONVERTED LAB)
INFLUENZA A ANTIGEN, POC: NEGATIVE
INFLUENZA B ANTIGEN, POC: NEGATIVE

## 2021-08-17 MED ORDER — NITROFURANTOIN MONOHYD MACRO 100 MG PO CAPS: 100.0000 mg | ORAL_CAPSULE | Freq: Two times a day (BID) | ORAL | 0 refills | Status: DC

## 2021-08-17 MED ORDER — CEPHALEXIN 500 MG PO CAPS: 500.0000 mg | ORAL_CAPSULE | Freq: Two times a day (BID) | ORAL | 0 refills | Status: DC

## 2021-08-17 NOTE — ED Provider Notes (Signed)
[facility]    CSN: [id number] Arrival date & time: 08/17/21  1708      History   Chief Complaint Chief Complaint  Patient presents with   Diarrhea   Dysuria    HPI Chloe Murray is a 75 y.o. female.   Patient is a 75 year old female who presents with chief complaint of stomach cramps and diarrhea since Friday, but this has improved with Imodium and Pepto-Bismol..  Patient does report a history of IBS but is not sure if that is the problem.  She also reports some nausea and a headache.  Patient also reports burning with urination and frequency as well as trouble sleeping.  Patient also states she took her dog to the vet last week and did wear a mask.  She reports some chills and fatigue.  Patient states her sister is coming to town this weekend for a visit and wants to make sure she is not going to pass anything along to her.     Past Medical History:  Diagnosis Date   Anxiety     There are no problems to display for this patient.   History reviewed. No pertinent surgical history.  OB History   No obstetric history on file.      Home Medications    Prior to Admission medications   Medication Sig Start Date End Date Taking? Authorizing Provider  ALPRAZolam Prudy Feeler) 0.25 MG tablet Take by mouth. 01/11/19  Yes [provider]  cephALEXin (KEFLEX) 500 MG capsule Take 1 capsule (500 mg total) by mouth 2 (two) times daily for 7 days. 08/17/21 08/24/21 Yes Candis Schatz, PA-C  famotidine (PEPCID) 20 MG tablet Take 1 tablet (20 mg total) by mouth 2 (two) times daily. 01/18/19  Yes Sharman Cheek, MD    Family History History reviewed. No pertinent family history.  Social History Social History   Tobacco Use   Smoking status: Former   Smokeless tobacco: Never  Building services engineer Use: Never used  Substance Use Topics   Alcohol use: Never   Drug use: Never     Allergies   Sertraline, Citalopram, and Penicillins   Review of  Systems Review of Systems as noted above in HPI.  Other systems reviewed and found to be negative   Physical Exam Triage Vital Signs ED Triage Vitals  Enc Vitals Group     BP 08/17/21 1818 (!) 145/63     Pulse Rate 08/17/21 1818 62     Resp 08/17/21 1818 18     Temp 08/17/21 1818 98.5 F (36.9 C)     Temp Source 08/17/21 1818 Oral     SpO2 08/17/21 1818 100 %     Weight 08/17/21 1816 103 lb (46.7 kg)     Height 08/17/21 1816 5\' 3"  (1.6 m)     Head Circumference --      Peak Flow --      Pain Score 08/17/21 1816 2     Pain Loc --      Pain Edu? --      Excl. in GC? --    No data found.  Updated Vital Signs BP (!) 145/63 (BP Location: Left Arm)   Pulse 62   Temp 98.5 F (36.9 C) (Oral)   Resp 18   Ht 5\' 3"  (1.6 m)   Wt 103 lb (46.7 kg)   SpO2 100%   BMI 18.25 kg/m    Physical Exam Constitutional:      General:  She is not in acute distress.    Appearance: Normal appearance. She is not ill-appearing.  HENT:     Head: Normocephalic and atraumatic.     Right Ear: Tympanic membrane and ear canal normal.     Left Ear: Tympanic membrane and ear canal normal.     Mouth/Throat:     Mouth: Mucous membranes are dry.     Pharynx: No oropharyngeal exudate or posterior oropharyngeal erythema.  Cardiovascular:     Rate and Rhythm: Normal rate and regular rhythm.     Pulses: Normal pulses.     Heart sounds: Normal heart sounds.  Pulmonary:     Effort: Pulmonary effort is normal.     Breath sounds: Normal breath sounds.  Abdominal:     General: Abdomen is flat. Bowel sounds are normal.     Tenderness: There is no abdominal tenderness. There is no right CVA tenderness or left CVA tenderness.  Musculoskeletal:        General: Normal range of motion.  Skin:    General: Skin is warm and dry.  Neurological:     General: No focal deficit present.     Mental Status: She is alert and oriented to person, place, and time.     UC Treatments / Results  Labs (all labs ordered  are listed, but only abnormal results are displayed) Labs Reviewed  POCT URINALYSIS DIP (DEVICE) - Abnormal; Notable for the following components:      Result Value   Hgb urine dipstick TRACE (*)    Leukocytes,Ua SMALL (*)    All other components within normal limits  URINE CULTURE  INFLUENZA A AND B ANTIGEN (CONVERTED LAB)  POCT URINALYSIS DIPSTICK, ED / UC  POC INFLUENZA A AND B ANTIGEN (URGENT CARE ONLY)    EKG   Radiology No results found.  Procedures Procedures (including critical care time)  Medications Ordered in UC Medications - No data to display  Initial Impression / Assessment and Plan / UC Course  I have reviewed the triage vital signs and the nursing notes.  Pertinent labs & imaging results that were available during my care of the patient were reviewed by me and considered in my medical decision making (see chart for details).    Patient with multiple complaints including frequency and burning with urination.  Initially patient reporting abdominal cramps, nausea and diarrhea but improved with diarrhea after taking Imodium and Pepto-Bismol.  Patient does report history of IBS.  Patient also reports she took her dog to the vet last week without wearing her mask and has had some chills, fatigue and night sweats.  She states she is drinking water but has sluggish skin turgor and dry mucous membranes.  Urine dipstick had trace hemoglobin and small leukocyte Estrace, will send for culture and give her prescription for antibiotic.  Have her push fluids.    Urine dipstick with positive leukocyte Estrace, will send for culture and add antibiotic.  Rapid flu was negative. Final Clinical Impressions(s) / UC Diagnoses   Final diagnoses:  Urinary tract infection without hematuria, site unspecified  Hypovolemia   Discharge Instructions   None    ED Prescriptions     Medication Sig Dispense Auth. Provider   nitrofurantoin, macrocrystal-monohydrate, (MACROBID) 100 MG  capsule  (Status: Discontinued) Take 1 capsule (100 mg total) by mouth 2 (two) times daily. 10 capsule Candis Schatz, PA-C   cephALEXin (KEFLEX) 500 MG capsule Take 1 capsule (500 mg total) by mouth 2 (two) times  daily for 7 days. 14 capsule Candis Schatz, PA-C      PDMP not reviewed this encounter.   Candis Schatz, PA-C 08/17/21 1935

## 2021-08-17 NOTE — Discharge Instructions (Addendum)
-  Urine is concerning for a UTI.  We will send it for culture.  We will start Keflex.  This is 1 tablet twice a day for a week. -Your flu swab is negative -Encourage you to increase your water intake as your exam is consistent with low fluid levels. -Continue with treatment of your symptoms with over-the-counter medicines as needed -Keep up your hard work but make sure you rest and stay hydrated.

## 2021-08-17 NOTE — ED Triage Notes (Signed)
Pt here with C/O with stomach cramps since Friday has had diarrhea but has IBS so not sure if that is the problem. Does have nausea/headache. If having burning with urination, having trouble sleeping. Sister is coming into town saturday and she wants to make sure she isn't going to give her anything

## 2021-08-19 ENCOUNTER — Ambulatory Visit
Admission: EM | Admit: 2021-08-19 | Discharge: 2021-08-19 | Disposition: A | Discharge status: Home / Self Care | Payer: Medicare HMO | Attending: Sports Medicine | Admitting: Sports Medicine

## 2021-08-19 ENCOUNTER — Other Ambulatory Visit: Payer: Self-pay

## 2021-08-19 DIAGNOSIS — R110 Nausea: Secondary | ICD-10-CM | POA: Diagnosis not present

## 2021-08-19 DIAGNOSIS — R5381 Other malaise: Secondary | ICD-10-CM | POA: Diagnosis present

## 2021-08-19 LAB — COMPREHENSIVE METABOLIC PANEL
ALT: 13 U/L (ref 0–44)
AST: 16 U/L (ref 15–41)
Albumin: 4.1 g/dL (ref 3.5–5.0)
Alkaline Phosphatase: 52 U/L (ref 38–126)
Anion gap: 7 (ref 5–15)
BUN: 12 mg/dL (ref 8–23)
CO2: 31 mmol/L (ref 22–32)
Calcium: 9.2 mg/dL (ref 8.9–10.3)
Chloride: 101 mmol/L (ref 98–111)
Creatinine, Ser: 0.65 mg/dL (ref 0.44–1.00)
GFR, Estimated: >60 mL/min (ref >60)
Glucose, Bld: 99 mg/dL (ref 70–99)
Potassium: 3.5 mmol/L (ref 3.5–5.1)
Sodium: 139 mmol/L (ref 135–145)
Total Bilirubin: 0.4 mg/dL (ref 0.3–1.2)
Total Protein: 7.5 g/dL (ref 6.5–8.1)

## 2021-08-19 LAB — URINE CULTURE: Culture: >=100000 — AB

## 2021-08-19 LAB — URINALYSIS, COMPLETE (UACMP) WITH MICROSCOPIC
Bilirubin Urine: NEGATIVE
Glucose, UA: NEGATIVE mg/dL
Ketones, ur: NEGATIVE mg/dL
Leukocytes,Ua: NEGATIVE
Nitrite: NEGATIVE
Protein, ur: NEGATIVE mg/dL
Specific Gravity, Urine: 1.01 (ref 1.005–1.030)
pH: 6.5 (ref 5.0–8.0)

## 2021-08-19 LAB — CBC WITH DIFFERENTIAL/PLATELET
Abs Immature Granulocytes: 0.05 10*3/uL (ref 0.00–0.07)
Basophils Absolute: 0 10*3/uL (ref 0.0–0.1)
Basophils Relative: 0 %
Eosinophils Absolute: 0.1 10*3/uL (ref 0.0–0.5)
Eosinophils Relative: 1 %
HCT: 36.6 % (ref 36.0–46.0)
Hemoglobin: 12.2 g/dL (ref 12.0–15.0)
Immature Granulocytes: 1 %
Lymphocytes Relative: 24 %
Lymphs Abs: 2.3 10*3/uL (ref 0.7–4.0)
MCH: 29.6 pg (ref 26.0–34.0)
MCHC: 33.3 g/dL (ref 30.0–36.0)
MCV: 88.8 fL (ref 80.0–100.0)
Monocytes Absolute: 0.7 10*3/uL (ref 0.1–1.0)
Monocytes Relative: 7 %
Neutro Abs: 6.6 10*3/uL (ref 1.7–7.7)
Neutrophils Relative %: 67 %
Platelets: 411 10*3/uL — ABNORMAL HIGH (ref 150–400)
RBC: 4.12 MIL/uL (ref 3.87–5.11)
RDW: 13.2 % (ref 11.5–15.5)
WBC: 9.7 10*3/uL (ref 4.0–10.5)
nRBC: 0 % (ref 0.0–0.2)

## 2021-08-19 MED ORDER — ONDANSETRON 8 MG PO TBDP: 8.0000 mg | ORAL_TABLET | Freq: Three times a day (TID) | ORAL | 0 refills | Status: DC | PRN

## 2021-08-19 NOTE — ED Triage Notes (Signed)
Pt c/o continued weakness, nausea and chills. Pt states she has been taking Bactrim as prescribed. Pt states she does use the cleaning wipes prior to urine collection. Pt does report elevated temp this morning 98.5, states her normal is closer to 97. Pt states the urinary burning has improved since on abx.

## 2021-08-19 NOTE — Discharge Instructions (Addendum)
Blood work today did not reveal the presence of a systemic infection.  I suspect that the nausea and malaise you are feeling is secondary to bacterial die off from the resolving urinary tract infection.  Use the Zofran every 8 hours as needed for nausea.  Continue to take your Bactrim as previously prescribed.  If you develop any new or worsening symptoms either return for reevaluation or see your primary care provider.

## 2021-08-19 NOTE — ED Provider Notes (Signed)
[facility]    CSN: [id number] Arrival date & time: 08/19/21  1308      History   Chief Complaint Chief Complaint  Patient presents with   Nausea   Weakness    HPI Chloe Murray is a 75 y.o. female.   HPI  75 year old female here for evaluation of weakness, nausea, and chills.  Valuated in this urgent care 2 days ago and diagnosed with a urinary tract infection.  Her initial complaint at that time had been stomach cramps and diarrhea for 3 days.  She is also been experiencing nausea and a headache in addition to the burning with urination and urinary frequency.  UA at that time showed small leukocytes and trace hemoglobin but negative for other abnormalities.  Urine culture showed mixed urogenital flora with suggestion of recollection.  Patient reports that she is here today because she is continuing to experience increased weakness, chills, and states that her temp is elevated for her as she typically runs 97 and she is 98.8 presently.  She is continuing to have urinary urgency and frequency but states that the burning has resolved.  She is also having back pain which she is attributing to her chronic back pain and also sweats in the mornings.  She denies any blood in her urine, vomiting, dizziness, fainting, or cloudiness to her urine.  Past Medical History:  Diagnosis Date   Anxiety     There are no problems to display for this patient.   History reviewed. No pertinent surgical history.  OB History   No obstetric history on file.      Home Medications    Prior to Admission medications   Medication Sig Start Date End Date Taking? Authorizing Provider  ALPRAZolam Prudy Feeler) 0.5 MG tablet Take 0.5 mg by mouth 3 (three) times daily as needed. 08/16/21  Yes [provider]  amitriptyline (ELAVIL) 25 MG tablet PLEASE SEE ATTACHED FOR DETAILED DIRECTIONS 05/01/21  Yes [provider]  famotidine (PEPCID) 20 MG tablet Take 1 tablet (20 mg total) by  mouth 2 (two) times daily. 01/18/19  Yes Sharman Cheek, MD  famotidine (PEPCID) 20 MG tablet Take by mouth. 03/10/21 03/10/22 Yes [provider]  ondansetron (ZOFRAN ODT) 8 MG disintegrating tablet Take 1 tablet (8 mg total) by mouth every 8 (eight) hours as needed for nausea or vomiting. 08/19/21  Yes Becky Augusta, NP  acetaminophen (TYLENOL) 500 MG tablet Take by mouth.    [provider]  sulfamethoxazole-trimethoprim (BACTRIM DS) 800-160 MG tablet Take 1 tablet by mouth 2 (two) times daily.    [provider]    Family History History reviewed. No pertinent family history.  Social History Social History   Tobacco Use   Smoking status: Former   Smokeless tobacco: Never  Building services engineer Use: Never used  Substance Use Topics   Alcohol use: Never   Drug use: Never     Allergies   Sertraline, Citalopram, and Penicillins   Review of Systems Review of Systems  Constitutional:  Positive for chills, diaphoresis and fatigue. Negative for activity change, appetite change and fever.  Gastrointestinal:  Positive for nausea. Negative for abdominal pain, diarrhea and vomiting.  Genitourinary:  Positive for frequency and urgency. Negative for dysuria and hematuria.  Musculoskeletal:  Positive for back pain.  Skin:  Negative for rash.  Hematological: Negative.   Psychiatric/Behavioral: Negative.      Physical Exam Triage Vital Signs ED Triage Vitals  Enc Vitals Group  BP 08/19/21 1416 (!) 160/84     Pulse Rate 08/19/21 1416 70     Resp 08/19/21 1416 18     Temp 08/19/21 1416 98.8 F (37.1 C)     Temp Source 08/19/21 1416 Oral     SpO2 08/19/21 1416 100 %     Weight 08/19/21 1412 99 lb (44.9 kg)     Height 08/19/21 1412 5\' 3"  (1.6 m)     Head Circumference --      Peak Flow --      Pain Score 08/19/21 1412 0     Pain Loc --      Pain Edu? --      Excl. in GC? --    No data found.  Updated Vital Signs BP (!) 160/84 (BP Location: Left  Arm)   Pulse 70   Temp 98.8 F (37.1 C) (Oral)   Resp 18   Ht 5\' 3"  (1.6 m)   Wt 99 lb (44.9 kg)   SpO2 100%   BMI 17.54 kg/m   Visual Acuity Right Eye Distance:   Left Eye Distance:   Bilateral Distance:    Right Eye Near:   Left Eye Near:    Bilateral Near:     Physical Exam Vitals and nursing note reviewed.  Constitutional:      General: She is not in acute distress.    Appearance: Normal appearance. She is normal weight. She is ill-appearing.  HENT:     Head: Normocephalic and atraumatic.  Cardiovascular:     Rate and Rhythm: Normal rate and regular rhythm.     Pulses: Normal pulses.     Heart sounds: Normal heart sounds. No murmur heard.   No gallop.  Pulmonary:     Effort: Pulmonary effort is normal.     Breath sounds: Normal breath sounds. No wheezing, rhonchi or rales.  Abdominal:     General: Abdomen is flat. Bowel sounds are normal.     Palpations: Abdomen is soft.     Tenderness: There is no abdominal tenderness. There is right CVA tenderness and left CVA tenderness. There is no guarding or rebound.  Skin:    General: Skin is warm and dry.     Capillary Refill: Capillary refill takes less than 2 seconds.     Findings: No erythema or rash.  Neurological:     General: No focal deficit present.     Mental Status: She is alert and oriented to person, place, and time.  Psychiatric:        Mood and Affect: Mood normal.        Behavior: Behavior normal.        Thought Content: Thought content normal.        Judgment: Judgment normal.     UC Treatments / Results  Labs (all labs ordered are listed, but only abnormal results are displayed) Labs Reviewed  URINALYSIS, COMPLETE (UACMP) WITH MICROSCOPIC - Abnormal; Notable for the following components:      Result Value   Color, Urine STRAW (*)    Hgb urine dipstick TRACE (*)    Bacteria, UA RARE (*)    All other components within normal limits  CBC WITH DIFFERENTIAL/PLATELET - Abnormal; Notable for the  following components:   Platelets 411 (*)    All other components within normal limits  COMPREHENSIVE METABOLIC PANEL    EKG   Radiology No results found.  Procedures Procedures (including critical care time)  Medications Ordered in UC Medications -  No data to display  Initial Impression / Assessment and Plan / UC Course  I have reviewed the triage vital signs and the nursing notes.  Pertinent labs & imaging results that were available during my care of the patient were reviewed by me and considered in my medical decision making (see chart for details).  Patient is a pleasant though ill-appearing 75 year old female here for evaluation of worsening weakness with nausea and chills in addition to the urinary complaints she was seen for 2 days ago.  Patient's urine showed possibility of infection on the urine dip but urine culture showed multiple species present and it was recommended for recollection.  Patient reports that she is now having back pain but she is attributing to her chronic back pain though she indicates that her fever is elevated as she normally runs 97 she is currently at 98 8.  She is experiencing chills and sweats in the morning.  Patient's physical exam reveals an ill-appearing patient with a benign cardiopulmonary exam.  Patient does have CVA tenderness bilaterally with left greater than right.  This was not present on her exam to days ago.  Abdomen is soft with positive bowel sounds all 4 quadrants.  There is no distention noted.  Patient does have left lower quadrant and suprapubic tenderness.  Patient denies any history of diverticulosis or diverticulitis.  She does have a history of IBS in the past.  She denies any blood in her stool.  Patient reports that her last colonoscopy was greater than 10 years ago.  There are no records present in epic for the colonoscopy.  Urinalysis was collected at triage.  Will order CBC and CMP to look for the presence of systemic  disease/infection.  Urinalysis shows straw appearance, trace hemoglobin, and rare bacteria.  Remainder the urinalysis was unremarkable.  CBC is unremarkable.  CMP is unremarkable.  Suspect that patient's feeling weak and nausea is secondary to the resolving urinary tract infection.  Will give patient prescription for Zofran to help with nausea and encouraged her to finish her antibiotic therapy.  She should follow-up with her primary care provider for any new or worsening symptoms or return for reevaluation.   Final Clinical Impressions(s) / UC Diagnoses   Final diagnoses:  Nausea  Malaise     Discharge Instructions      Blood work today did not reveal the presence of a systemic infection.  I suspect that the nausea and malaise you are feeling is secondary to bacterial die off from the resolving urinary tract infection.  Use the Zofran every 8 hours as needed for nausea.  Continue to take your Bactrim as previously prescribed.  If you develop any new or worsening symptoms either return for reevaluation or see your primary care provider.     ED Prescriptions     Medication Sig Dispense Auth. Provider   ondansetron (ZOFRAN ODT) 8 MG disintegrating tablet Take 1 tablet (8 mg total) by mouth every 8 (eight) hours as needed for nausea or vomiting. 20 tablet Becky Augusta, NP      PDMP not reviewed this encounter.   Becky Augusta, NP 08/19/21 [phone]

## 2021-08-28 ENCOUNTER — Other Ambulatory Visit: Payer: Self-pay

## 2021-08-28 ENCOUNTER — Emergency Department: Payer: Medicare HMO

## 2021-08-28 ENCOUNTER — Emergency Department
Admission: EM | Admit: 2021-08-28 | Discharge: 2021-08-28 | Disposition: A | Discharge status: Home / Self Care | Payer: Medicare HMO | Attending: Emergency Medicine | Admitting: Emergency Medicine

## 2021-08-28 DIAGNOSIS — R5383 Other fatigue: Secondary | ICD-10-CM | POA: Diagnosis not present

## 2021-08-28 DIAGNOSIS — K5792 Diverticulitis of intestine, part unspecified, without perforation or abscess without bleeding: Secondary | ICD-10-CM | POA: Diagnosis not present

## 2021-08-28 DIAGNOSIS — R5381 Other malaise: Secondary | ICD-10-CM | POA: Diagnosis not present

## 2021-08-28 DIAGNOSIS — R109 Unspecified abdominal pain: Secondary | ICD-10-CM | POA: Diagnosis present

## 2021-08-28 DIAGNOSIS — R531 Weakness: Secondary | ICD-10-CM | POA: Insufficient documentation

## 2021-08-28 DIAGNOSIS — Z87891 Personal history of nicotine dependence: Secondary | ICD-10-CM | POA: Diagnosis not present

## 2021-08-28 DIAGNOSIS — R059 Cough, unspecified: Secondary | ICD-10-CM | POA: Diagnosis not present

## 2021-08-28 LAB — COMPREHENSIVE METABOLIC PANEL
ALT: 19 U/L (ref 0–44)
AST: 22 U/L (ref 15–41)
Albumin: 4 g/dL (ref 3.5–5.0)
Alkaline Phosphatase: 54 U/L (ref 38–126)
Anion gap: 10 (ref 5–15)
BUN: 14 mg/dL (ref 8–23)
CO2: 27 mmol/L (ref 22–32)
Calcium: 9.6 mg/dL (ref 8.9–10.3)
Chloride: 96 mmol/L — ABNORMAL LOW (ref 98–111)
Creatinine, Ser: 0.78 mg/dL (ref 0.44–1.00)
GFR, Estimated: >60 mL/min (ref >60)
Glucose, Bld: 99 mg/dL (ref 70–99)
Potassium: 4.3 mmol/L (ref 3.5–5.1)
Sodium: 133 mmol/L — ABNORMAL LOW (ref 135–145)
Total Bilirubin: 0.3 mg/dL (ref 0.3–1.2)
Total Protein: 7.2 g/dL (ref 6.5–8.1)

## 2021-08-28 LAB — CBC
HCT: 39.3 % (ref 36.0–46.0)
Hemoglobin: 13.1 g/dL (ref 12.0–15.0)
MCH: 30.8 pg (ref 26.0–34.0)
MCHC: 33.3 g/dL (ref 30.0–36.0)
MCV: 92.3 fL (ref 80.0–100.0)
Platelets: 384 10*3/uL (ref 150–400)
RBC: 4.26 MIL/uL (ref 3.87–5.11)
RDW: 13.2 % (ref 11.5–15.5)
WBC: 15.7 10*3/uL — ABNORMAL HIGH (ref 4.0–10.5)
nRBC: 0 % (ref 0.0–0.2)

## 2021-08-28 LAB — URINALYSIS, COMPLETE (UACMP) WITH MICROSCOPIC
Bacteria, UA: NONE SEEN
Bilirubin Urine: NEGATIVE
Glucose, UA: NEGATIVE mg/dL
Hgb urine dipstick: NEGATIVE
Ketones, ur: NEGATIVE mg/dL
Leukocytes,Ua: NEGATIVE
Nitrite: NEGATIVE
Protein, ur: NEGATIVE mg/dL
Specific Gravity, Urine: <1.005 — ABNORMAL LOW (ref 1.005–1.030)
WBC, UA: NONE SEEN WBC/hpf (ref 0–5)
pH: 5.5 (ref 5.0–8.0)

## 2021-08-28 LAB — TROPONIN I (HIGH SENSITIVITY): Troponin I (High Sensitivity): 3 ng/L (ref <18)

## 2021-08-28 LAB — LIPASE, BLOOD: Lipase: 50 U/L (ref 11–51)

## 2021-08-28 MED ORDER — ACETAMINOPHEN 500 MG PO TABS
1000.0000 mg | ORAL_TABLET | Freq: Once | ORAL | Status: AC
  Administered 2021-08-28: 1000 mg via ORAL
  Filled 2021-08-28: qty 2

## 2021-08-28 MED ORDER — LACTATED RINGERS IV BOLUS
1000.0000 mL | Freq: Once | INTRAVENOUS | Status: AC
  Administered 2021-08-28: 1000 mL via INTRAVENOUS

## 2021-08-28 MED ORDER — IOHEXOL 350 MG/ML SOLN
100.0000 mL | Freq: Once | INTRAVENOUS | Status: AC | PRN
  Administered 2021-08-28: 100 mL via INTRAVENOUS

## 2021-08-28 NOTE — ED Notes (Signed)
Pt transported to radiology.

## 2021-08-28 NOTE — ED Provider Notes (Signed)
[facility] Emergency Department Provider Note ____________________________________________   Event Date/Time   First MD Initiated Contact with Patient 08/28/21 [phone]     (approximate)  I have reviewed the triage vital signs and the nursing notes.  HISTORY  Chief Complaint Abdominal Pain   HPI Chloe Murray is a 75 y.o. femalewho presents to the ED for evaluation of worsening abd pain.   Chart review indicates patient has been seen twice in the past 10 days at urgent care.  First on 8/29 where she was diagnosed with acute cystitis and discharged on Keflex.  Again on 8/31 due to persistent nausea and weakness.  Prescribed Zofran and discharged.  Patient presents to the ED for evaluation of worsening generalized weakness, fatigue.  She reports awakening this morning with chills and sweats and subjective fevers, accompanied by generalized malaise.  She reports compliance with her Keflex and that her urinary symptoms have resolved.  She reports a chronic dysphagia of pills and some solids, with occasional choking events.  She reports a remote smoking history.  She reports a cough that seems little worse than normal over the past few days.  Denies significant increase sputum production.  After coughing this morning, she reports right sided atraumatic thoracic back pain that is since waned and gone.  Denies shortness of breath or chest pain/pressure.  [place] and has had no decreased exercise tolerance, and she has been able to do her typical tasks.  Denies falls or trauma, syncopal episodes.  Denies abdominal pain or emesis. She self-reports a history of IBS with intermittent diarrhea chronically, as well as this past week, improved with Pepto-Bismol.  Past Medical History:  Diagnosis Date   Anxiety     There are no problems to display for this patient.   History reviewed. No pertinent surgical history.  Prior to Admission  medications   Medication Sig Start Date End Date Taking? Authorizing Provider  acetaminophen (TYLENOL) 500 MG tablet Take by mouth.    [provider]  ALPRAZolam Prudy Feeler) 0.5 MG tablet Take 0.5 mg by mouth 3 (three) times daily as needed. 08/16/21   [provider]  amitriptyline (ELAVIL) 25 MG tablet PLEASE SEE ATTACHED FOR DETAILED DIRECTIONS 05/01/21   [provider]  famotidine (PEPCID) 20 MG tablet Take 1 tablet (20 mg total) by mouth 2 (two) times daily. 01/18/19   Sharman Cheek, MD  famotidine (PEPCID) 20 MG tablet Take by mouth. 03/10/21 03/10/22  [provider]  ondansetron (ZOFRAN ODT) 8 MG disintegrating tablet Take 1 tablet (8 mg total) by mouth every 8 (eight) hours as needed for nausea or vomiting. 08/19/21   Becky Augusta, NP  sulfamethoxazole-trimethoprim (BACTRIM DS) 800-160 MG tablet Take 1 tablet by mouth 2 (two) times daily.    [provider]    Allergies Sertraline, Citalopram, and Penicillins  No family history on file.  Social History Social History   Tobacco Use   Smoking status: Former   Smokeless tobacco: Never  Building services engineer Use: Never used  Substance Use Topics   Alcohol use: Never   Drug use: Never    Review of Systems  Constitutional: Positive for subjective fever/chills Eyes: No visual changes. ENT: No sore throat. Cardiovascular: Denies chest pain. Respiratory: Denies shortness of breath.  Positive for cough Gastrointestinal: No abdominal pain.   no vomiting.  No constipation. Positive for nausea and diarrhea Genitourinary: Negative for dysuria. Musculoskeletal: Positive for atraumatic  right-sided thoracic back pain. Skin: Negative for rash. Neurological: Negative for headaches, focal weakness or numbness.  ____________________________________________   PHYSICAL EXAM:  VITAL SIGNS: Vitals:   08/28/21 0819  BP: 126/60  Pulse: 64  Resp: 18  Temp: 97.6 F (36.4 C)  SpO2: 100%     Constitutional: Alert and oriented. Well appearing and in no acute distress.  Sitting upright on the side of the bed, well-appearing and conversational in full sentences. Eyes: Conjunctivae are normal. PERRL. EOMI. Head: Atraumatic. Nose: No congestion/rhinnorhea. Mouth/Throat: Mucous membranes are moist.  Oropharynx non-erythematous. Neck: No stridor. No cervical spine tenderness to palpation. Cardiovascular: Normal rate, regular rhythm. Grossly normal heart sounds.  Good peripheral circulation. Respiratory: Normal respiratory effort.  No retractions. Lungs CTAB. Gastrointestinal: Soft , nondistended. No CVA tenderness. Mild LLQ and epigastric tenderness without peritoneal features. Musculoskeletal: No lower extremity tenderness nor edema.  No joint effusions. No signs of acute trauma. Neurologic:  Normal speech and language. No gross focal neurologic deficits are appreciated. No gait instability noted. Skin:  Skin is warm, dry and intact. No rash noted. Psychiatric: Mood and affect are normal. Speech and behavior are normal. ____________________________________________   LABS (all labs ordered are listed, but only abnormal results are displayed)  Labs Reviewed  COMPREHENSIVE METABOLIC PANEL - Abnormal; Notable for the following components:      Result Value   Sodium 133 (*)    Chloride 96 (*)    All other components within normal limits  CBC - Abnormal; Notable for the following components:   WBC 15.7 (*)    All other components within normal limits  URINALYSIS, COMPLETE (UACMP) WITH MICROSCOPIC - Abnormal; Notable for the following components:   Specific Gravity, Urine <1.005 (*)    All other components within normal limits  LIPASE, BLOOD  TROPONIN I (HIGH SENSITIVITY)  TROPONIN I (HIGH SENSITIVITY)   ____________________________________________  12 Lead EKG Sinus rhythm with a rate of 68 bpm.  Normal axis.  Incomplete right bundle.  No evidence of acute  ischemia.  ____________________________________________  RADIOLOGY  ED MD interpretation: 2 view CXR reviewed by me without evidence of acute cardiopulmonary pathology.  Official radiology report(s): DG Chest 2 View  Result Date: 08/28/2021 CLINICAL DATA:  Cough. EXAM: CHEST - 2 VIEW COMPARISON:  July 06, 2020. FINDINGS: The heart size and mediastinal contours are within normal limits. Both lungs are clear. The visualized skeletal structures are unremarkable. IMPRESSION: No active cardiopulmonary disease. Aortic Atherosclerosis (ICD10-I70.0). Electronically Signed   By: Lupita Raider M.D.   On: 08/28/2021 10:52   CT ABDOMEN PELVIS W CONTRAST  Result Date: 08/28/2021 CLINICAL DATA:  epigastric and LLQ pain, diarrhea. eval divertic, SBO EXAM: CT ABDOMEN AND PELVIS WITH CONTRAST TECHNIQUE: Multidetector CT imaging of the abdomen and pelvis was performed using the standard protocol following bolus administration of intravenous contrast. CONTRAST:  OMNIPAQUE IOHEXOL 350 MG/ML SOLN COMPARISON:  CT abdomen pelvis 01/18/2019 FINDINGS: Lower chest: No acute abnormality. Hepatobiliary: No focal liver abnormality is seen. Prior cholecystectomy. Pancreas: Unremarkable. No pancreatic ductal dilatation or surrounding inflammatory changes. Spleen: Normal in size without focal abnormality. Adrenals/Urinary Tract: Adrenal glands are unremarkable. Kidneys are normal, without renal calculi, focal lesion, or hydronephrosis. Bladder is unremarkable. Stomach/Bowel: Stomach is within normal limits. There is no evidence of bowel obstruction. The appendix is normal. There is sigmoid diverticulosis with focal mild adjacent inflammatory stranding (coronal image 50). Vascular/Lymphatic: Aortoiliac atherosclerotic calcifications. No AAA. No lymphadenopathy. Reproductive: Prior hysterectomy. There is a 2.4 cm simple  appearing cystic lesion in the right adnexa, not significantly changed from prior CT in January 2020. Other:  No drainable fluid collection/abscess. Musculoskeletal: No acute osseous abnormality. No suspicious lytic or blastic lesions. Mild bilateral hip degenerative changes. Multilevel degenerative changes of the spine with superior endplate deformity of T12, possibly a Schmorl's node or age-indeterminate fracture. This is new since prior CT. IMPRESSION: Acute, uncomplicated diverticulitis of the sigmoid colon. No drainable fluid collection/abscess. New superior endplate deformity of T12, possibly a Schmorl's node or age-indeterminate fracture, new since prior CT in January 2020. Electronically Signed   By: Caprice Renshaw M.D.   On: 08/28/2021 13:27    ____________________________________________   PROCEDURES and INTERVENTIONS  Procedure(s) performed (including Critical Care):  .1-3 Lead EKG Interpretation Performed by: Delton Prairie, MD Authorized by: Delton Prairie, MD     Interpretation: normal     ECG rate:  66   ECG rate assessment: normal     Rhythm: sinus rhythm     Ectopy: none     Conduction: normal    Medications  lactated ringers bolus 1,000 mL (1,000 mLs Intravenous New Bag/Given 08/28/21 1035)  acetaminophen (TYLENOL) tablet 1,000 mg (1,000 mg Oral Given 08/28/21 1034)  iohexol (OMNIPAQUE) 350 MG/ML injection 100 mL (100 mLs Intravenous Contrast Given 08/28/21 1239)    ____________________________________________   MDM / ED COURSE   75 year old woman presents to the ED with generalized discomfort, abdominal bloating and subjective chills, with evidence of uncomplicated diverticulitis amenable to outpatient management.  Normal vitals on room air.  Exam with mild LLQ tenderness without peritoneal features.  She otherwise looks well without evidence of distress, neurologic or vascular deficits.  Blood work with mild and isolated leukocytosis.  Urine without infectious features.  No evidence of untreated UTI or progression to pyelonephritis.  No evidence of ACS.  CT obtained due to her  persistent symptoms despite multiple visits recently, and demonstrates evidence of acute uncomplicated diverticulitis.  No signs of abscess or perforation.  We discussed bowel rest, GI follow-up and return precautions to the ED prior to discharge.  Clinical Course as of 08/28/21 1350  Fri Aug 28, 2021  1220 Call the lab bc troponin has been "collected" but in process for awhile [DS]  1350 Reassessed.  Patient reports feeling better.  She is eager to go home.  We discussed diverticulitis, following up with GI and return precautions for the ED. [DS]    Clinical Course User Index [DS] Delton Prairie, MD    ____________________________________________   FINAL CLINICAL IMPRESSION(S) / ED DIAGNOSES  Final diagnoses:  Diverticulitis     ED Discharge Orders     None        Tamera Pingley   Note:  This document was prepared using Dragon voice recognition software and may include unintentional dictation errors.    Delton Prairie, MD 08/28/21 1351

## 2021-08-28 NOTE — ED Triage Notes (Signed)
Pt comes with c/o bloated abdomen, chills and N/V. Pt states this has been going on for weeks. Pt states she has went to UC and was prescribed meds for UTI.  Pt states she just doesn't feel any better.

## 2021-08-28 NOTE — ED Notes (Signed)
Pt transported to xray 

## 2022-08-28 ENCOUNTER — Other Ambulatory Visit: Payer: Self-pay

## 2022-08-28 ENCOUNTER — Emergency Department
Admission: EM | Admit: 2022-08-28 | Discharge: 2022-08-28 | Disposition: A | Discharge status: Home / Self Care | Payer: Medicare HMO | Attending: Emergency Medicine | Admitting: Emergency Medicine

## 2022-08-28 ENCOUNTER — Emergency Department: Payer: Medicare HMO

## 2022-08-28 DIAGNOSIS — R1032 Left lower quadrant pain: Secondary | ICD-10-CM

## 2022-08-28 DIAGNOSIS — K5732 Diverticulitis of large intestine without perforation or abscess without bleeding: Secondary | ICD-10-CM | POA: Insufficient documentation

## 2022-08-28 LAB — COMPREHENSIVE METABOLIC PANEL
ALT: 14 U/L (ref 0–44)
AST: 22 U/L (ref 15–41)
Albumin: 4.4 g/dL (ref 3.5–5.0)
Alkaline Phosphatase: 41 U/L (ref 38–126)
Anion gap: 12 (ref 5–15)
BUN: 15 mg/dL (ref 8–23)
CO2: 26 mmol/L (ref 22–32)
Calcium: 9.7 mg/dL (ref 8.9–10.3)
Chloride: 98 mmol/L (ref 98–111)
Creatinine, Ser: 0.88 mg/dL (ref 0.44–1.00)
GFR, Estimated: >60 mL/min (ref >60)
Glucose, Bld: 103 mg/dL — ABNORMAL HIGH (ref 70–99)
Potassium: 4.2 mmol/L (ref 3.5–5.1)
Sodium: 136 mmol/L (ref 135–145)
Total Bilirubin: 0.7 mg/dL (ref 0.3–1.2)
Total Protein: 7.6 g/dL (ref 6.5–8.1)

## 2022-08-28 LAB — CBC
HCT: 40.7 % (ref 36.0–46.0)
Hemoglobin: 12.9 g/dL (ref 12.0–15.0)
MCH: 29.7 pg (ref 26.0–34.0)
MCHC: 31.7 g/dL (ref 30.0–36.0)
MCV: 93.8 fL (ref 80.0–100.0)
Platelets: 388 10*3/uL (ref 150–400)
RBC: 4.34 MIL/uL (ref 3.87–5.11)
RDW: 12.6 % (ref 11.5–15.5)
WBC: 8.3 10*3/uL (ref 4.0–10.5)
nRBC: 0 % (ref 0.0–0.2)

## 2022-08-28 LAB — URINALYSIS, ROUTINE W REFLEX MICROSCOPIC
Bilirubin Urine: NEGATIVE
Glucose, UA: NEGATIVE mg/dL
Hgb urine dipstick: NEGATIVE
Ketones, ur: NEGATIVE mg/dL
Leukocytes,Ua: NEGATIVE
Nitrite: NEGATIVE
Protein, ur: NEGATIVE mg/dL
Specific Gravity, Urine: 1.004 — ABNORMAL LOW (ref 1.005–1.030)
pH: 7 (ref 5.0–8.0)

## 2022-08-28 LAB — LIPASE, BLOOD: Lipase: 48 U/L (ref 11–51)

## 2022-08-28 MED ORDER — METRONIDAZOLE 500 MG PO TABS: 500.0000 mg | ORAL_TABLET | Freq: Three times a day (TID) | ORAL | 0 refills | Status: AC

## 2022-08-28 MED ORDER — CIPROFLOXACIN HCL 500 MG PO TABS: 500.0000 mg | ORAL_TABLET | Freq: Two times a day (BID) | ORAL | 0 refills | Status: AC

## 2022-08-28 MED ORDER — IOHEXOL 300 MG/ML  SOLN
100.0000 mL | Freq: Once | INTRAMUSCULAR | Status: AC | PRN
  Administered 2022-08-28: 100 mL via INTRAVENOUS

## 2022-08-28 NOTE — ED Provider Notes (Signed)
[facility] Provider Note   Event Date/Time   First MD Initiated Contact with Patient 08/28/22 1233     (approximate) History  Nausea and Abdominal Pain  HPI Chloe Murray is a 76 y.o. 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  Patient also endorses some left lower quadrant abdominal pain that is been worsening over the last 24 hours.  Patient currently describes it as a 5/10, aching, nonradiating pain that is worse after eating. ROS: Patient currently denies any vision changes, tinnitus, difficulty speaking, facial droop, sore throat, chest pain, shortness of breath, vomiting, dysuria, or weakness/numbness/paresthesias in any extremity   Physical Exam  Triage Vital Signs: ED Triage Vitals  Enc Vitals Group     BP 08/28/22 1123 (!) 124/59     Pulse Rate 08/28/22 1123 66     Resp 08/28/22 1123 17     Temp 08/28/22 1123 97.8 F (36.6 C)     Temp Source 08/28/22 1123 Oral     SpO2 08/28/22 1123 100 %     Weight --      Height --      Head Circumference --      Peak Flow --      Pain Score 08/28/22 1124 5     Pain Loc --      Pain Edu? --      Excl. in GC? --    Most recent vital signs: Vitals:   08/28/22 1235 08/28/22 1441  BP: (!) 142/54   Pulse: 64   Resp: 16   Temp:  97.8 F (36.6 C)  SpO2: 98%    General: Awake, oriented x4. CV:  Good peripheral perfusion.  Resp:  Normal effort.  Abd:  No distention.  Other:  Elderly Caucasian female laying in bed in no acute distress ED Results / Procedures / Treatments  Labs (all labs ordered are listed, but only abnormal results are displayed) Labs Reviewed  COMPREHENSIVE METABOLIC PANEL - Abnormal; Notable for the following components:      Result Value   Glucose, Bld 103 (*)    All other components within normal limits  URINALYSIS, ROUTINE W REFLEX MICROSCOPIC - Abnormal;  Notable for the following components:   Color, Urine STRAW (*)    APPearance CLEAR (*)    Specific Gravity, Urine 1.004 (*)    All other components within normal limits  LIPASE, BLOOD  CBC   RADIOLOGY ED MD interpretation: CT of the abdomen and pelvis with IV contrast interpreted by me and shows acute uncomplicated sigmoid diverticulitis -Agree with radiology assessment Official radiology report(s): CT Abdomen Pelvis W Contrast  Result Date: 08/28/2022 CLINICAL DATA:  Nonlocalized abdominal pain. EXAM: CT ABDOMEN AND PELVIS WITH CONTRAST TECHNIQUE: Multidetector CT imaging of the abdomen and pelvis was performed using the standard protocol following bolus administration of intravenous contrast. RADIATION DOSE REDUCTION: This exam was performed according to the departmental dose-optimization program which includes automated exposure control, adjustment of the mA and/or kV according to patient size and/or use of iterative reconstruction technique. CONTRAST:  OMNIPAQUE IOHEXOL 300 MG/ML  SOLN COMPARISON:  August 28, 2021 FINDINGS: Lower chest: No acute abnormality on this motion degraded examination of the lung bases. Hepatobiliary: No suspicious hepatic lesion. Gallbladder is surgically absent. No biliary ductal dilation. Pancreas: No pancreatic ductal dilation or evidence of acute inflammation. Spleen: No splenomegaly or  focal splenic lesion. Adrenals/Urinary Tract: Bilateral adrenal glands appear normal. No hydronephrosis. Kidneys demonstrate symmetric enhancement and excretion of contrast material. Urinary bladder is unremarkable for degree of distension. Stomach/Bowel: No radiopaque enteric contrast material was administered. Stomach is unremarkable for degree of distension. No pathologic dilation of small or large bowel. The appendix and terminal ileum appear normal. Diffuse colonic diverticulosis with some mild wall thickening of the sigmoid colon in very mild adjacent stranding possibly  reflecting acute uncomplicated early/mild sigmoid diverticulitis. Vascular/Lymphatic: Aortic atherosclerosis. No pathologically enlarged abdominal or pelvic lymph nodes. Reproductive: Status post hysterectomy. No adnexal masses. Other: No walled off fluid collection.  No pneumoperitoneum. Musculoskeletal: No acute osseous abnormality. Mild multilevel degenerative changes spine. Similar superior endplate deformity at T12 likely related to a Schmorl's node. IMPRESSION: 1. Possible mild/early acute uncomplicated sigmoid diverticulitis. 2.  Aortic Atherosclerosis (ICD10-I70.0). Electronically Signed   By: Maudry Mayhew M.D.   On: 08/28/2022 14:03   PROCEDURES: Critical Care performed: No .1-3 Lead EKG Interpretation  Performed by: Merwyn Katos, MD Authorized by: Merwyn Katos, MD     Interpretation: normal     ECG rate:  66   ECG rate assessment: normal     Rhythm: sinus rhythm     Ectopy: none     Conduction: normal    MEDICATIONS ORDERED IN ED: Medications  iohexol (OMNIPAQUE) 300 MG/ML solution 100 mL (100 mLs Intravenous Contrast Given 08/28/22 1335)   IMPRESSION / MDM / ASSESSMENT AND PLAN / ED COURSE  I reviewed the triage vital signs and the nursing notes.                             The patient is on the cardiac monitor to evaluate for evidence of arrhythmia and/or significant heart rate changes. Patient's presentation is most consistent with acute presentation with potential threat to life or bodily function. Patient has diverticulitis that is amenable to oral antibiotics. Patient has no peritoneal signs or signs of perforation. Patients symptoms not typical for other emergent causes of abdominal pain such as, but not limited to, appendicitis, abdominal aortic aneurysm, surgical biliary disease, acute coronary syndrome, etc.  Patient will be discharged with strict return precautions and follow up with primary MD within 12-24 hours for further evaluation.  Patient understands that  they may require admission and IV antibiotics and possibly surgery if they do not improve with oral antibiotics.   FINAL CLINICAL IMPRESSION(S) / ED DIAGNOSES   Final diagnoses:  Diverticulitis of colon  Left lower quadrant abdominal pain   Rx / DC Orders   ED Discharge Orders          Ordered    ciprofloxacin (CIPRO) 500 MG tablet  2 times daily        08/28/22 1429    metroNIDAZOLE (FLAGYL) 500 MG tablet  3 times daily        08/28/22 1429           Note:  This document was prepared using Dragon voice recognition software and may include unintentional dictation errors.   Merwyn Katos, MD 08/28/22 [id number]

## 2022-08-28 NOTE — ED Notes (Signed)
Pt to ED for ongoing abnormal Bms (hx IBS with diarrhea), and LLQ abdominal pain with diverticulosis dx in January.

## 2022-08-28 NOTE — ED Triage Notes (Signed)
Pt presents to ED with c/o of nausea and diarrhea for the past week and states today she felt "chills". Pt does endorse ABD pain.

## 2022-08-31 ENCOUNTER — Other Ambulatory Visit: Payer: Self-pay

## 2022-08-31 ENCOUNTER — Emergency Department
Admission: EM | Admit: 2022-08-31 | Discharge: 2022-08-31 | Disposition: A | Discharge status: Home / Self Care | Payer: Medicare HMO | Attending: Emergency Medicine | Admitting: Emergency Medicine

## 2022-08-31 DIAGNOSIS — R197 Diarrhea, unspecified: Secondary | ICD-10-CM | POA: Insufficient documentation

## 2022-08-31 DIAGNOSIS — Z20822 Contact with and (suspected) exposure to covid-19: Secondary | ICD-10-CM | POA: Diagnosis not present

## 2022-08-31 DIAGNOSIS — R531 Weakness: Secondary | ICD-10-CM | POA: Insufficient documentation

## 2022-08-31 DIAGNOSIS — R5383 Other fatigue: Secondary | ICD-10-CM | POA: Insufficient documentation

## 2022-08-31 DIAGNOSIS — R109 Unspecified abdominal pain: Secondary | ICD-10-CM | POA: Insufficient documentation

## 2022-08-31 LAB — URINALYSIS, COMPLETE (UACMP) WITH MICROSCOPIC
Bacteria, UA: NONE SEEN
Bilirubin Urine: NEGATIVE
Glucose, UA: NEGATIVE mg/dL
Hgb urine dipstick: NEGATIVE
Ketones, ur: NEGATIVE mg/dL
Leukocytes,Ua: NEGATIVE
Nitrite: NEGATIVE
Protein, ur: NEGATIVE mg/dL
Specific Gravity, Urine: 1.003 — ABNORMAL LOW (ref 1.005–1.030)
pH: 6 (ref 5.0–8.0)

## 2022-08-31 LAB — COMPREHENSIVE METABOLIC PANEL
ALT: 14 U/L (ref 0–44)
AST: 18 U/L (ref 15–41)
Albumin: 4.3 g/dL (ref 3.5–5.0)
Alkaline Phosphatase: 44 U/L (ref 38–126)
Anion gap: 9 (ref 5–15)
BUN: 10 mg/dL (ref 8–23)
CO2: 29 mmol/L (ref 22–32)
Calcium: 9.4 mg/dL (ref 8.9–10.3)
Chloride: 97 mmol/L — ABNORMAL LOW (ref 98–111)
Creatinine, Ser: 0.87 mg/dL (ref 0.44–1.00)
GFR, Estimated: >60 mL/min (ref >60)
Glucose, Bld: 109 mg/dL — ABNORMAL HIGH (ref 70–99)
Potassium: 4 mmol/L (ref 3.5–5.1)
Sodium: 135 mmol/L (ref 135–145)
Total Bilirubin: 0.7 mg/dL (ref 0.3–1.2)
Total Protein: 7.4 g/dL (ref 6.5–8.1)

## 2022-08-31 LAB — CBC
HCT: 37.5 % (ref 36.0–46.0)
Hemoglobin: 12 g/dL (ref 12.0–15.0)
MCH: 29.1 pg (ref 26.0–34.0)
MCHC: 32 g/dL (ref 30.0–36.0)
MCV: 91 fL (ref 80.0–100.0)
Platelets: 370 10*3/uL (ref 150–400)
RBC: 4.12 MIL/uL (ref 3.87–5.11)
RDW: 12.3 % (ref 11.5–15.5)
WBC: 8.8 10*3/uL (ref 4.0–10.5)
nRBC: 0 % (ref 0.0–0.2)

## 2022-08-31 LAB — TROPONIN I (HIGH SENSITIVITY)
Troponin I (High Sensitivity): 2 ng/L (ref <18)
Troponin I (High Sensitivity): 3 ng/L (ref <18)

## 2022-08-31 LAB — SARS CORONAVIRUS 2 BY RT PCR: SARS Coronavirus 2 by RT PCR: NEGATIVE

## 2022-08-31 MED ORDER — SODIUM CHLORIDE 0.9 % IV BOLUS
1000.0000 mL | Freq: Once | INTRAVENOUS | Status: AC
  Administered 2022-08-31: 1000 mL via INTRAVENOUS

## 2022-08-31 NOTE — ED Provider Notes (Signed)
[facility] Provider Note    Event Date/Time   First MD Initiated Contact with Patient 08/31/22 1102     (approximate)  History   Chief Complaint: Weakness  HPI  Chloe Murray is a 76 y.o. female with a past medical history of anxiety who presents to the emergency department for generalized weakness.  According to the patient and record review patient was seen in the emergency department 08/28/2022 with complaints of weakness diarrhea and abdominal pain.  Patient had a CT scan, I have reviewed the read which stated possible mild/early diverticulitis and the patient was discharged on ciprofloxacin and Flagyl.  Patient states she has been taking the ciprofloxacin but states the Flagyl is too big to swallow so she has not been taking Flagyl.  Patient states she continues to have intermittent diarrhea but states a history of IBS.  Denies any fever but states she was nauseated with some dry heaving or vomiting this morning.  Mild abdominal discomfort but largely unchanged from 3 days ago.  No dysuria.  Patient states she was feeling weak so she came to the emergency department.  Physical Exam   Triage Vital Signs: ED Triage Vitals  Enc Vitals Group     BP 08/31/22 1052 (!) 141/53     Pulse Rate 08/31/22 1052 63     Resp 08/31/22 1052 16     Temp 08/31/22 1052 98.4 F (36.9 C)     Temp Source 08/31/22 1052 Oral     SpO2 08/31/22 1052 100 %     Weight 08/31/22 1053 98 lb 15.8 oz (44.9 kg)     Height 08/31/22 1053 5\' 3"  (1.6 m)     Head Circumference --      Peak Flow --      Pain Score 08/31/22 1052 5     Pain Loc --      Pain Edu? --      Excl. in GC? --     Most recent vital signs: Vitals:   08/31/22 1052  BP: (!) 141/53  Pulse: 63  Resp: 16  Temp: 98.4 F (36.9 C)  SpO2: 100%    General: Awake, no distress.  CV:  Good peripheral perfusion.  Regular rate and rhythm  Resp:  Normal effort.  Equal breath sounds bilaterally.  Abd:  No distention.   Soft, slight left lower quadrant tenderness.  No rebound or guarding.    ED Results / Procedures / Treatments   EKG  EKG viewed and interpreted by myself shows a normal sinus rhythm at 58 bpm with a narrow QRS, normal axis, normal intervals, nonspecific ST changes.   MEDICATIONS ORDERED IN ED: Medications  sodium chloride 0.9 % bolus 1,000 mL (has no administration in time range)     IMPRESSION / MDM / ASSESSMENT AND PLAN / ED COURSE  I reviewed the triage vital signs and the nursing notes.  Patient's presentation is most consistent with acute presentation with potential threat to life or bodily function.  Patient presents to the emergency department for generalized weakness and fatigue.  Overall the patient appears well, no distress.  Reassuring physical exam, reassuring vitals.  However given the patient's generalized weakness we will check labs, urinalysis, COVID test.  We will IV hydrate and continue to closely monitor.  Patient agreeable to plan of care.  Patient was recently diagnosed with mild diverticulitis.  Patient has been taking ciprofloxacin I discussed with the patient to start taking the Flagyl as well which  she can break in half or crush if needed.  Is not entirely clear if the patient has diverticulitis or if that is causing today's symptoms.  We will reassess after fluids and after labs have resulted.  Patient's labs are reassuring, chemistry is normal, troponin negative, CBC is normal, COVID is negative, urinalysis is normal.  Patient appears very well, no findings on my work-up or exam.  Patient is very anxious about taking her antibiotics.  She has read the adverse side effects for ciprofloxacin and is worried about taking this medication which she believes has caused most of her symptoms here today.  I discussed with the patient as her CT was read as possible mild diverticulitis I believe would be safe to stop the antibiotics and to monitor and if her abdominal pain  returns then she should follow-up with her doctor to discuss a different antibiotic regimen given the patient's antibiotic allergies.  However given the patient's reassuring work-up reassuring exam I believe the patient is safe for discharge home with PCP follow-up.  Patient is agreeable to this plan.  FINAL CLINICAL IMPRESSION(S) / ED DIAGNOSES   Weakness    Note:  This document was prepared using Dragon voice recognition software and may include unintentional dictation errors.   Minna Antis, MD 08/31/22 1354

## 2022-08-31 NOTE — ED Notes (Signed)
D/C and reasons to return discussed with pt, pt verbalized understanding. Friend with pt. NAD noted.

## 2022-08-31 NOTE — ED Triage Notes (Signed)
Pt here with weakness. Pt states she here recently for same but now states her weakness is getting worse. Pt also c/o pain behind her right ear going into her throat making it hard for her to keep anything down.

## 2022-11-07 ENCOUNTER — Encounter: Payer: Self-pay | Admitting: Emergency Medicine

## 2022-11-07 ENCOUNTER — Emergency Department: Payer: Medicare HMO

## 2022-11-07 ENCOUNTER — Other Ambulatory Visit: Payer: Self-pay

## 2022-11-07 ENCOUNTER — Emergency Department
Admission: EM | Admit: 2022-11-07 | Discharge: 2022-11-07 | Disposition: A | Discharge status: Home / Self Care | Payer: Medicare HMO | Attending: Emergency Medicine | Admitting: Emergency Medicine

## 2022-11-07 DIAGNOSIS — S20212A Contusion of left front wall of thorax, initial encounter: Secondary | ICD-10-CM | POA: Insufficient documentation

## 2022-11-07 DIAGNOSIS — W541XXA Struck by dog, initial encounter: Secondary | ICD-10-CM | POA: Insufficient documentation

## 2022-11-07 DIAGNOSIS — W19XXXA Unspecified fall, initial encounter: Secondary | ICD-10-CM

## 2022-11-07 DIAGNOSIS — S299XXA Unspecified injury of thorax, initial encounter: Secondary | ICD-10-CM | POA: Diagnosis present

## 2022-11-07 MED ORDER — IBUPROFEN 600 MG PO TABS
600.0000 mg | ORAL_TABLET | Freq: Once | ORAL | Status: DC
  Filled 2022-11-07: qty 1

## 2022-11-07 MED ORDER — LIDOCAINE 5 % EX PTCH
1.0000 | MEDICATED_PATCH | CUTANEOUS | Status: DC
  Administered 2022-11-07: 1 via TRANSDERMAL
  Filled 2022-11-07: qty 1

## 2022-11-07 NOTE — ED Triage Notes (Signed)
Pt arrived via POV with SO, pt reports fall this morning around 0400, pt states she tripped over her dog and fell on the L side of her ribs onto a horse planter that she has. Pt states she did not have any LOC, states she does have abrasion to L leg, pt ambulatory on arrival. Alert and oriented, pt guarding L side rib cage.

## 2022-11-07 NOTE — ED Provider Notes (Signed)
[facility] Provider Note    Event Date/Time   First MD Initiated Contact with Patient 11/07/22 [phone]     (approximate)   History   Rib Injury   HPI  Chloe Murray is a 76 y.o. female who presents today for evaluation of left-sided rib pain after a trip and fall this morning.  Patient reports that she was letting her dogs out at 4 AM, and tripped over her dog in the dark and hit her left side of her ribs on a horse feeder.  She denies fall all the way to the ground, denies head strike or LOC.  She was able to get up and ambulate bring her dogs back into the house without assistance.  She reports that she has pain in her left-sided ribs with deep inspiration and palpation of this area.  She denies abdominal pain.  She does not feel short of breath.  No vomiting.  No headache.  She does not take anticoagulation.  She took Tylenol Extra Strength this morning.  There are no problems to display for this patient.         Physical Exam   Triage Vital Signs: ED Triage Vitals  Enc Vitals Group     BP 11/07/22 0622 (!) 106/58     Pulse Rate 11/07/22 0622 68     Resp 11/07/22 0622 18     Temp 11/07/22 0622 98.1 F (36.7 C)     Temp Source 11/07/22 0622 Oral     SpO2 11/07/22 0622 99 %     Weight 11/07/22 0622 102 lb (46.3 kg)     Height 11/07/22 0622 5\' 3"  (1.6 m)     Head Circumference --      Peak Flow --      Pain Score 11/07/22 0625 4     Pain Loc --      Pain Edu? --      Excl. in GC? --     Most recent vital signs: Vitals:   11/07/22 0622  BP: (!) 106/58  Pulse: 68  Resp: 18  Temp: 98.1 F (36.7 C)  SpO2: 99%    Physical Exam Vitals and nursing note reviewed.  Constitutional:      General: Awake and alert. No acute distress.    Appearance: Normal appearance. The patient is normal weight.  HENT:     Head: Normocephalic and atraumatic.     Mouth: Mucous membranes are moist.  Eyes:     General: PERRL. Normal EOMs        Right eye:  No discharge.        Left eye: No discharge.     Conjunctiva/sclera: Conjunctivae normal.  Cardiovascular:     Rate and Rhythm: Normal rate.     Pulses: Normal pulses.  Pulmonary:     Effort: Pulmonary effort is normal. No respiratory distress.     Breath sounds: Normal breath sounds.  Left lateral tenderness to palpation of her rib cage.  No ecchymosis or crepitus. Abdominal:     Abdomen is soft. There is no abdominal tenderness. No rebound or guarding. No distention.  No splenomegaly or tenderness.  No abdominal ecchymosis Musculoskeletal:        General: No swelling. Normal range of motion.     Cervical back: Normal range of motion and neck supple.  Skin:    General: Skin is warm and dry.     Capillary Refill: Capillary refill takes less than 2 seconds.  Findings: No rash.  Neurological:     Mental Status: The patient is awake and alert.      ED Results / Procedures / Treatments   Labs (all labs ordered are listed, but only abnormal results are displayed) Labs Reviewed - No data to display   EKG     RADIOLOGY I independently reviewed and interpreted imaging and agree with radiologists findings.     PROCEDURES:  Critical Care performed:   Procedures   MEDICATIONS ORDERED IN ED: Medications  lidocaine (LIDODERM) 5 % 1 patch (1 patch Transdermal Patch Applied 11/07/22 0800)  ibuprofen (ADVIL) tablet 600 mg (600 mg Oral Not Given 11/07/22 0801)     IMPRESSION / MDM / ASSESSMENT AND PLAN / ED COURSE  I reviewed the triage vital signs and the nursing notes.   Differential diagnosis includes, but is not limited to, fracture, contusion, pneumothorax.  Patient is awake and alert, hemodynamically stable and afebrile.  She has normal oxygen saturation of 99% on room air.  She has tenderness over her left lateral ribs without ecchymosis or crepitus.  Chest x-ray with rib series obtained in triage is negative for any acute bony injury or pneumothorax.  Patient has  no abdominal tenderness or ecchymosis to suggest intra-abdominal injury or splenic laceration.  She was treated symptomatically with improvement of her symptoms.  She was also given incentive spirometer and was able to pull over 1000.  She reported mild improvement after the Lidoderm patches and reports that she feels ready to go home.  She declines a prescription for these as she reports that she has them at home already.  She also declined ibuprofen.  We discussed return precautions and the importance of close outpatient follow-up.  Patient understands and agrees with plan.  She was discharged with her family member.  All questions were answered.   Patient's presentation is most consistent with acute complicated illness / injury requiring diagnostic workup.      FINAL CLINICAL IMPRESSION(S) / ED DIAGNOSES   Final diagnoses:  Rib contusion, left, initial encounter  Fall, initial encounter     Rx / DC Orders   ED Discharge Orders     None        Note:  This document was prepared using Dragon voice recognition software and may include unintentional dictation errors.   Jackelyn Hoehn, PA-C 11/07/22 0831    Georga Hacking, MD 11/07/22 [phone]

## 2022-11-07 NOTE — Discharge Instructions (Addendum)
Your x-ray did not reveal any broken bones.  You may use the patches as needed for pain control.  Please also use the incentive spirometer as we discussed to help fully aerate your lungs and reduce your chances of developing an infection.  Please return for any new, worsening, or change in symptoms or other concerns.  It was a pleasure caring for you today.

## 2022-11-07 NOTE — ED Notes (Signed)
PA at bedside.

## 2022-11-07 NOTE — ED Notes (Signed)
Handed incentive spirometer to pt and instructed on its use.

## 2022-11-07 NOTE — ED Notes (Signed)
See triage note. Pt tripped and fell onto concrete "horse planter" this am. Hit L rib area. Pt is guarding with hands but able to take deep breaths. No bruising noted. Lung fields are clear and equal bilaterally.

## 2023-05-30 IMAGING — CT CT ABD-PELV W/ CM
2 of 6 series · 16 of 46 positions shown, 18 images · IV contrast (APPLIED)
Comparison: CT abdomen pelvis 01/18/2019

CLINICAL DATA: epigastric and LLQ pain, diarrhea. eval divertic,
SBO

EXAM:
CT ABDOMEN AND PELVIS WITH CONTRAST
TECHNIQUE: Multidetector CT imaging of the abdomen and pelvis was performed
using the standard protocol following bolus administration of
intravenous contrast.
CONTRAST:  100mL OMNIPAQUE IOHEXOL 350 MG/ML SOLN

[Series 2: routine abd/pel with · axial · 0.66mm/px · z∈[-502,-142]mm · 13 of 82 slices shown, 15 images]
[im 5/82  soft-tissue]
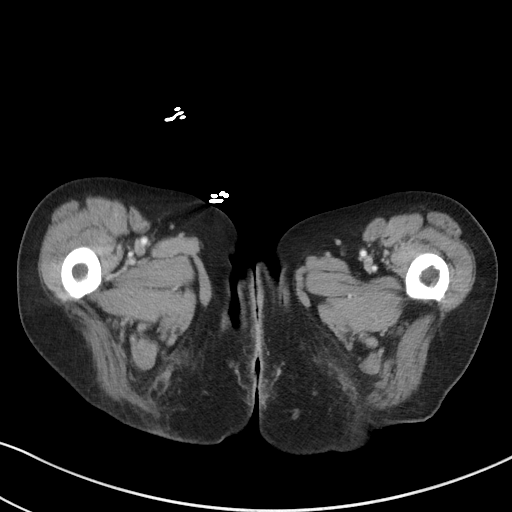
[im 5/82  bone]
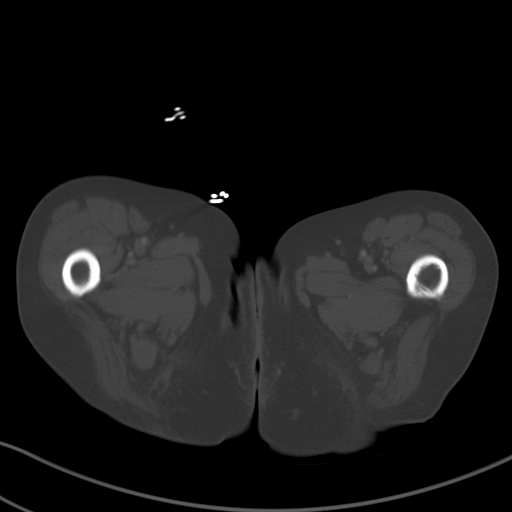
[im 10/82  soft-tissue]
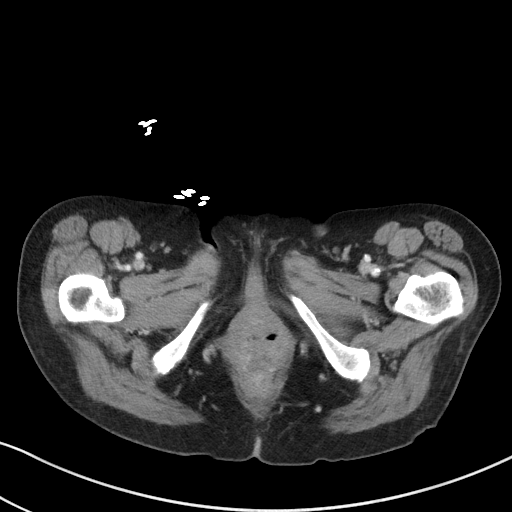
[im 20/82  soft-tissue]
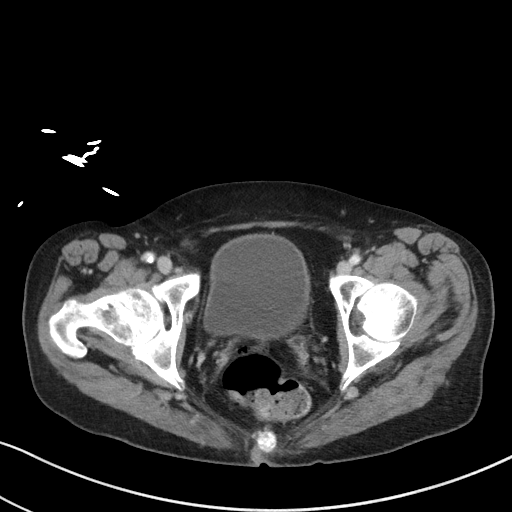
[im 24/82  soft-tissue]
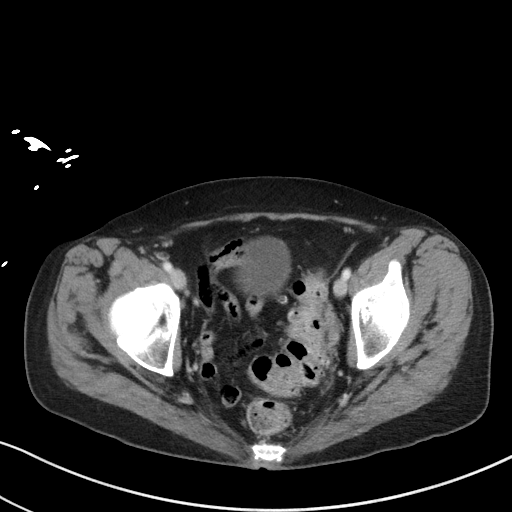
[im 29/82  soft-tissue]
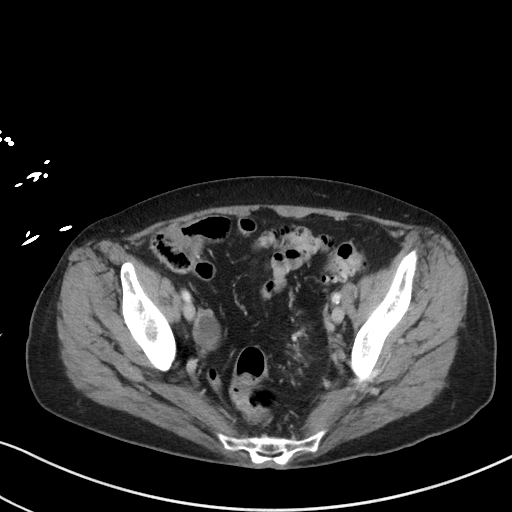
[im 34/82  soft-tissue]
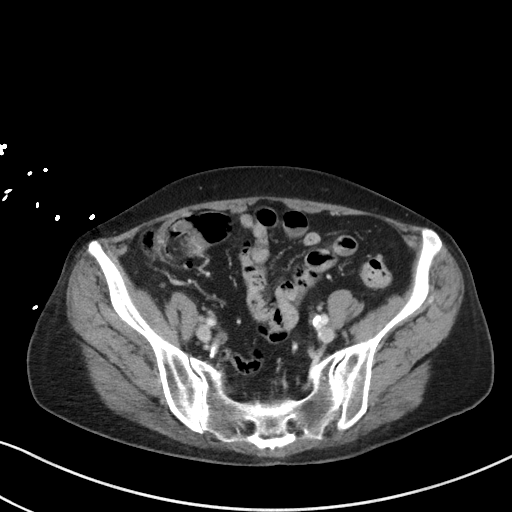
[im 43/82  soft-tissue]
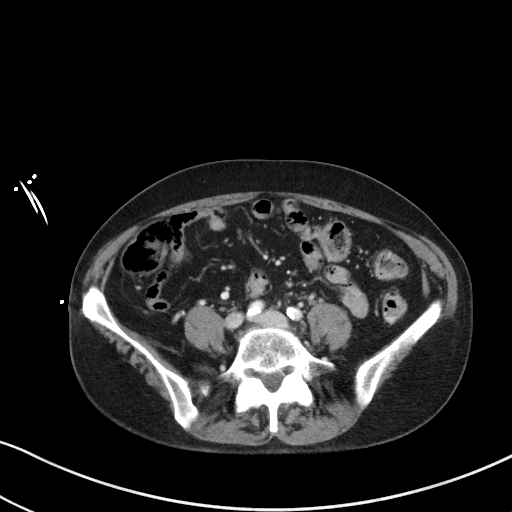
[im 48/82  soft-tissue]
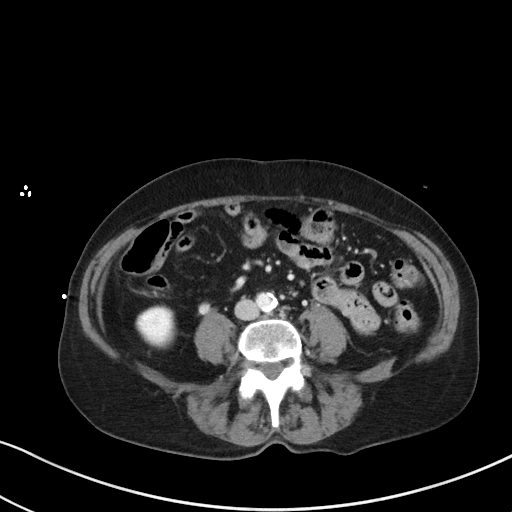
[im 53/82  soft-tissue]
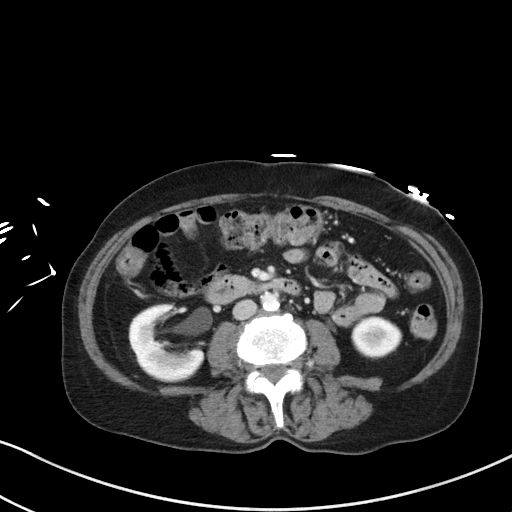
[im 53/82  bone]
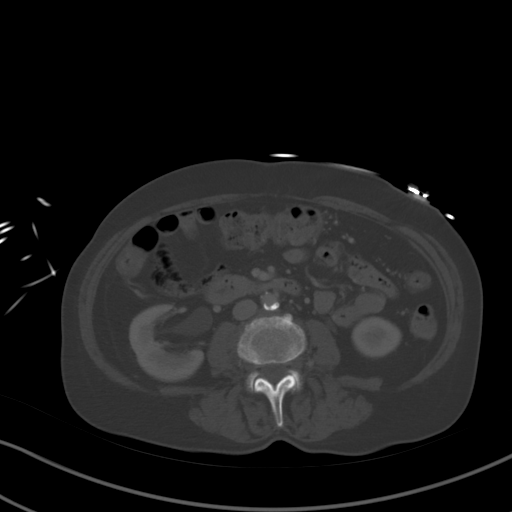
[im 58/82  soft-tissue]
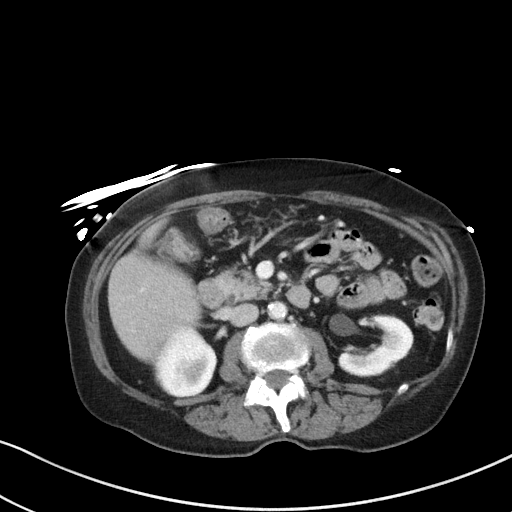
[im 62/82  soft-tissue]
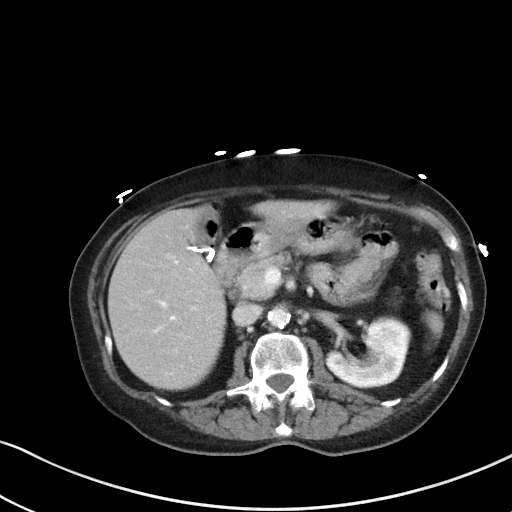
[im 72/82  soft-tissue]
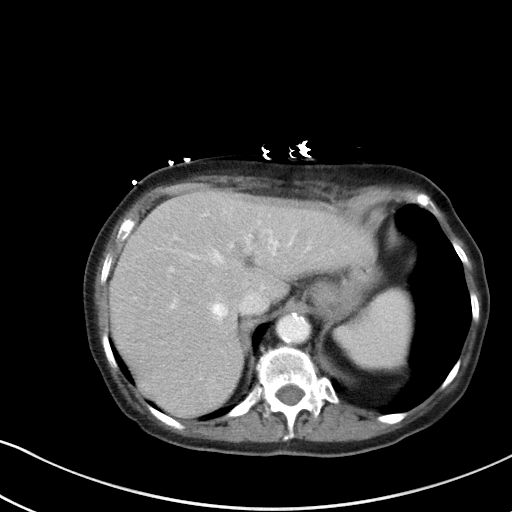
[im 77/82  soft-tissue]
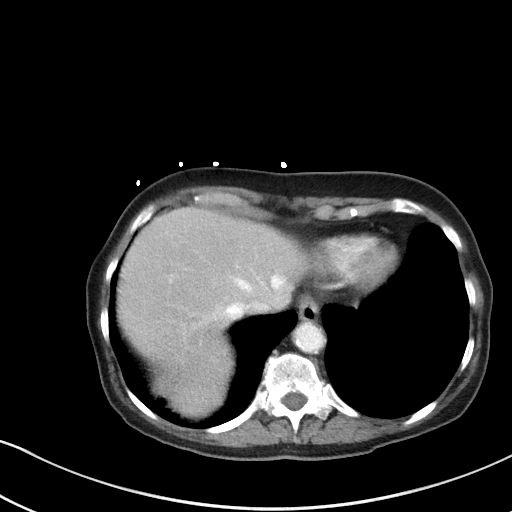

[Series 5: coronal st · coronal · 0.71mm/px · 3 of 75 slices shown]
[im 19/75  soft-tissue]
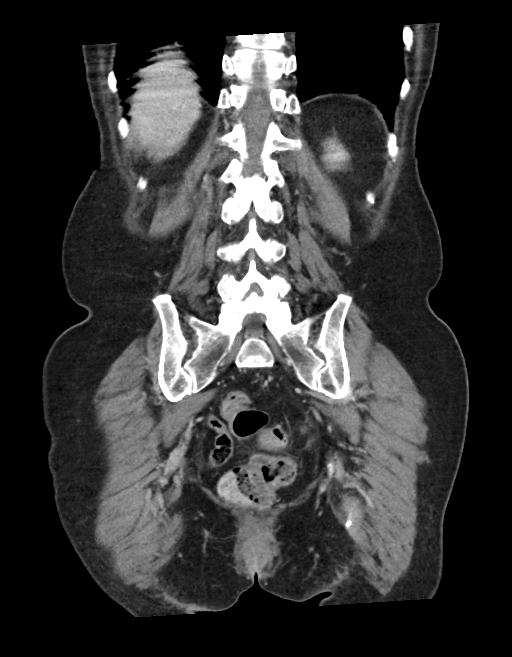
[im 38/75  soft-tissue]
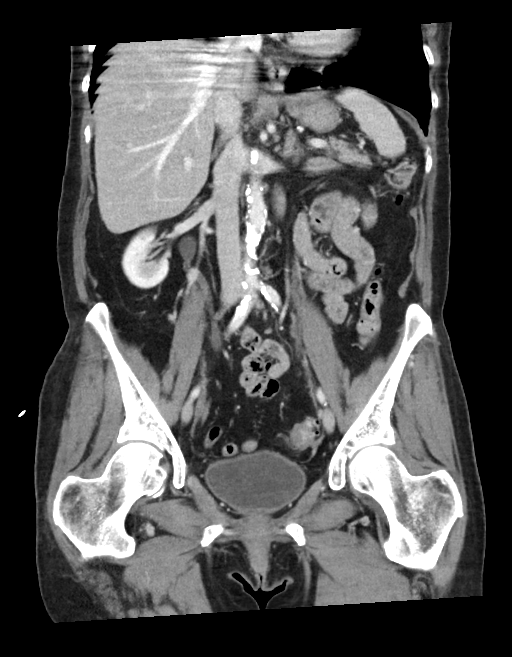
[im 56/75  soft-tissue]
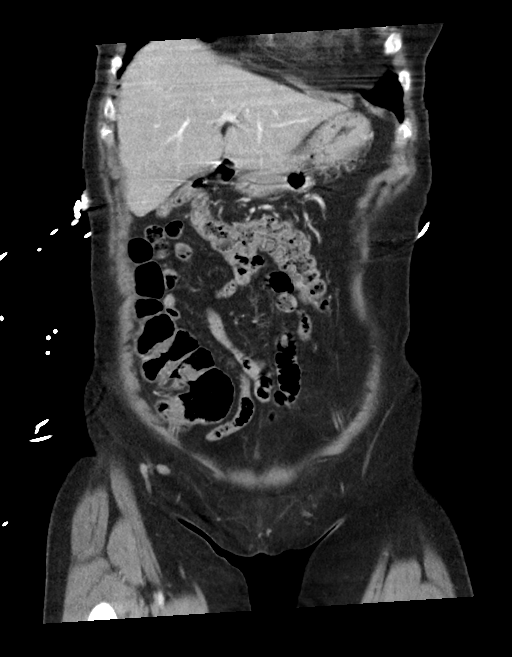

[16 of 46 positions shown; findings below may reference images not displayed]

FINDINGS: Lower chest: No acute abnormality.

Hepatobiliary: No focal liver abnormality is seen. Prior
cholecystectomy.

Pancreas: Unremarkable. No pancreatic ductal dilatation or
surrounding inflammatory changes.

Spleen: Normal in size without focal abnormality.

Adrenals/Urinary Tract: Adrenal glands are unremarkable. Kidneys are
normal, without renal calculi, focal lesion, or hydronephrosis.
Bladder is unremarkable.

Stomach/Bowel: Stomach is within normal limits. There is no evidence
of bowel obstruction. The appendix is normal. There is sigmoid
diverticulosis with focal mild adjacent inflammatory stranding
(coronal image 50).

Vascular/Lymphatic: Aortoiliac atherosclerotic calcifications. No
AAA. No lymphadenopathy.

Reproductive: Prior hysterectomy. There is a 2.4 cm simple appearing
cystic lesion in the right adnexa, not significantly changed from
prior CT in December 2018.

Other: No drainable fluid collection/abscess.

Musculoskeletal: No acute osseous abnormality. No suspicious lytic
or blastic lesions. Mild bilateral hip degenerative changes.
Multilevel degenerative changes of the spine with superior endplate
deformity of T12, possibly a Schmorl's node or age-indeterminate
fracture. This is new since prior CT.
IMPRESSION: Acute, uncomplicated diverticulitis of the sigmoid colon. No
drainable fluid collection/abscess.

New superior endplate deformity of T12, possibly a Schmorl's node or
age-indeterminate fracture, new since prior CT in December 2018.

## 2023-11-04 ENCOUNTER — Ambulatory Visit
Admission: EM | Admit: 2023-11-04 | Discharge: 2023-11-04 | Disposition: A | Discharge status: Home / Self Care | Payer: Medicare HMO | Attending: Emergency Medicine | Admitting: Emergency Medicine

## 2023-11-04 ENCOUNTER — Encounter: Payer: Self-pay | Admitting: Emergency Medicine

## 2023-11-04 DIAGNOSIS — R197 Diarrhea, unspecified: Secondary | ICD-10-CM | POA: Insufficient documentation

## 2023-11-04 DIAGNOSIS — J069 Acute upper respiratory infection, unspecified: Secondary | ICD-10-CM | POA: Diagnosis present

## 2023-11-04 HISTORY — DX: Diverticulitis of intestine, part unspecified, without perforation or abscess without bleeding: K57.92

## 2023-11-04 LAB — RESP PANEL BY RT-PCR (RSV, FLU A&B, COVID)  RVPGX2
Influenza A by PCR: NEGATIVE
Influenza B by PCR: NEGATIVE
Resp Syncytial Virus by PCR: NEGATIVE
SARS Coronavirus 2 by RT PCR: NEGATIVE

## 2023-11-04 MED ORDER — IPRATROPIUM BROMIDE 0.06 % NA SOLN: 2.0000 | Freq: Four times a day (QID) | NASAL | 12 refills | Status: AC

## 2023-11-04 MED ORDER — PROMETHAZINE-DM 6.25-15 MG/5ML PO SYRP: 5.0000 mL | ORAL_SOLUTION | Freq: Four times a day (QID) | ORAL | 0 refills | Status: DC | PRN

## 2023-11-04 MED ORDER — BENZONATATE 100 MG PO CAPS: 200.0000 mg | ORAL_CAPSULE | Freq: Three times a day (TID) | ORAL | 0 refills | Status: DC

## 2023-11-04 NOTE — Discharge Instructions (Addendum)
Your testing today was negative for COVID, influenza, and RSV.  I do believe you have a respiratory virus that is causing your symptoms.  You may use over-the-counter Tylenol as needed for any fever or bodyaches that you may experience.  You may continue to use over-the-counter Imodium, Kaopectate, or Pepto-Bismol as needed for diarrhea and upset stomach.  Use the Atrovent nasal spray, 2 squirts in each nostril every 6 hours, as needed for runny nose and postnasal drip.  Use the Tessalon Perles every 8 hours during the day.  Take them with a small sip of water.  They may give you some numbness to the base of your tongue or a metallic taste in your mouth, this is normal.  Use the Promethazine DM cough syrup at bedtime for cough and congestion.  It will make you drowsy so do not take it during the day.  Return for reevaluation or see your primary care provider for any new or worsening symptoms.

## 2023-11-04 NOTE — ED Triage Notes (Signed)
Patient c/o cough, nasal congestion, diarrhea that started yesterday.  Patient unsure of fevers.

## 2023-11-04 NOTE — ED Provider Notes (Signed)
[facility]    CSN: [id number] Arrival date & time: 11/04/23  1151      History   Chief Complaint Chief Complaint  Patient presents with   Cough   Diarrhea    HPI Nioma Prusha is a 77 y.o. female.   HPI  77 year old female with a past medical history significant for anxiety and diverticulitis presents for evaluation of a mixture of respiratory and GI symptoms which began yesterday.  She reports subjective fever with chills last night along with nasal congestion, clear nasal discharge, sneezing, nonproductive cough, and diarrhea.  She denies shortness breath or wheezing.  She reports that she had 3 diarrhea stools yesterday and 2 this morning.  None of them had blood in them.  She has used over-the-counter Imodium for her diarrhea.  She reports that she is unvaccinated but that her son and daughter-in-law both recently had COVID.  She denies any other sick contacts or recent travel.  Past Medical History:  Diagnosis Date   Anxiety    Diverticulitis     There are no problems to display for this patient.   History reviewed. No pertinent surgical history.  OB History   No obstetric history on file.      Home Medications    Prior to Admission medications   Medication Sig Start Date End Date Taking? Authorizing Provider  ALPRAZolam Prudy Feeler) 0.5 MG tablet Take 0.5 mg by mouth 3 (three) times daily as needed. 08/16/21  Yes [provider]  benzonatate (TESSALON) 100 MG capsule Take 2 capsules (200 mg total) by mouth every 8 (eight) hours. 11/04/23  Yes Becky Augusta, NP  famotidine (PEPCID) 20 MG tablet Take 1 tablet (20 mg total) by mouth 2 (two) times daily. 01/18/19  Yes Sharman Cheek, MD  ipratropium (ATROVENT) 0.06 % nasal spray Place 2 sprays into both nostrils 4 (four) times daily. 11/04/23  Yes Becky Augusta, NP  promethazine-dextromethorphan (PROMETHAZINE-DM) 6.25-15 MG/5ML syrup Take 5 mLs by mouth 4 (four) times daily as needed. 11/04/23  Yes  Becky Augusta, NP  acetaminophen (TYLENOL) 500 MG tablet Take by mouth.    [provider]    Family History History reviewed. No pertinent family history.  Social History Social History   Tobacco Use   Smoking status: Former   Smokeless tobacco: Never  Advertising account planner   Vaping status: Never Used  Substance Use Topics   Alcohol use: Never   Drug use: Never     Allergies   Sertraline, Citalopram, and Penicillins   Review of Systems Review of Systems  Constitutional:  Positive for chills and fever.  HENT:  Positive for congestion, rhinorrhea and sneezing.   Respiratory:  Positive for cough. Negative for shortness of breath and wheezing.   Gastrointestinal:  Positive for diarrhea. Negative for nausea and vomiting.     Physical Exam Triage Vital Signs ED Triage Vitals  Encounter Vitals Group     BP 11/04/23 1202 139/63     Systolic BP Percentile --      Diastolic BP Percentile --      Pulse Rate 11/04/23 1202 75     Resp 11/04/23 1202 14     Temp 11/04/23 1202 98.1 F (36.7 C)     Temp Source 11/04/23 1202 Oral     SpO2 11/04/23 1202 100 %     Weight 11/04/23 1200 102 lb 1.2 oz (46.3 kg)     Height 11/04/23 1200 5\' 3"  (1.6 m)     Head Circumference --  Peak Flow --      Pain Score 11/04/23 1159 0     Pain Loc --      Pain Education --      Exclude from Growth Chart --    No data found.  Updated Vital Signs BP 139/63 (BP Location: Left Arm)   Pulse 75   Temp 98.1 F (36.7 C) (Oral)   Resp 14   Ht 5\' 3"  (1.6 m)   Wt 102 lb 1.2 oz (46.3 kg)   SpO2 100%   BMI 18.08 kg/m   Visual Acuity Right Eye Distance:   Left Eye Distance:   Bilateral Distance:    Right Eye Near:   Left Eye Near:    Bilateral Near:     Physical Exam Vitals and nursing note reviewed.  Constitutional:      Appearance: Normal appearance. She is not ill-appearing.  HENT:     Head: Normocephalic and atraumatic.     Right Ear: Tympanic membrane, ear canal and external  ear normal. There is no impacted cerumen.     Left Ear: Tympanic membrane, ear canal and external ear normal. There is no impacted cerumen.     Nose: Congestion and rhinorrhea present.     Comments: Nasal mucosa is edematous and pale with scant clear discharge in both nares.    Mouth/Throat:     Mouth: Mucous membranes are moist.     Pharynx: Oropharynx is clear. No oropharyngeal exudate or posterior oropharyngeal erythema.  Cardiovascular:     Rate and Rhythm: Normal rate and regular rhythm.     Pulses: Normal pulses.     Heart sounds: Normal heart sounds. No murmur heard.    No friction rub. No gallop.  Pulmonary:     Effort: Pulmonary effort is normal.     Breath sounds: Normal breath sounds. No wheezing, rhonchi or rales.  Musculoskeletal:     Cervical back: Normal range of motion and neck supple. No tenderness.  Lymphadenopathy:     Cervical: No cervical adenopathy.  Skin:    General: Skin is warm and dry.     Capillary Refill: Capillary refill takes less than 2 seconds.     Findings: No rash.  Neurological:     General: No focal deficit present.     Mental Status: She is alert and oriented to person, place, and time.      UC Treatments / Results  Labs (all labs ordered are listed, but only abnormal results are displayed) Labs Reviewed  RESP PANEL BY RT-PCR (RSV, FLU A&B, COVID)  [id number]    EKG   Radiology No results found.  Procedures Procedures (including critical care time)  Medications Ordered in UC Medications - No data to display  Initial Impression / Assessment and Plan / UC Course  I have reviewed the triage vital signs and the nursing notes.  Pertinent labs & imaging results that were available during my care of the patient were reviewed by me and considered in my medical decision making (see chart for details).   Patient is a nontoxic-appearing 20 old female presenting for evaluation of acute onset respiratory and GI symptoms as outlined in HPI  above.  Patient is unvaccinated against COVID but she was recently around her son and daughter-in-law who both were positive for COVID.  She denies any other sick contacts or exposures.  I will order a respiratory panel to evaluate for the presence of COVID, influenza, or RSV.  Respiratory panel is negative for  COVID, influenza, and RSV.  I will discharge patient home with a diagnosis of viral URI with a cough with prescription for Atrovent nasal spray, Tessalon Perles, and Promethazine DM cough syrup she can use for cough and congestion.  I will also give her a list of food choices you can use to help with her diarrhea.  She may also continue to use over-the-counter Imodium or other over-the-counter antidiarrheals as needed.   Final Clinical Impressions(s) / UC Diagnoses   Final diagnoses:  Viral URI with cough  Diarrhea, unspecified type     Discharge Instructions      Your testing today was negative for COVID, influenza, and RSV.  I do believe you have a respiratory virus that is causing your symptoms.  You may use over-the-counter Tylenol as needed for any fever or bodyaches that you may experience.  You may continue to use over-the-counter Imodium, Kaopectate, or Pepto-Bismol as needed for diarrhea and upset stomach.  Use the Atrovent nasal spray, 2 squirts in each nostril every 6 hours, as needed for runny nose and postnasal drip.  Use the Tessalon Perles every 8 hours during the day.  Take them with a small sip of water.  They may give you some numbness to the base of your tongue or a metallic taste in your mouth, this is normal.  Use the Promethazine DM cough syrup at bedtime for cough and congestion.  It will make you drowsy so do not take it during the day.  Return for reevaluation or see your primary care provider for any new or worsening symptoms.      ED Prescriptions     Medication Sig Dispense Auth. Provider   benzonatate (TESSALON) 100 MG capsule Take 2 capsules  (200 mg total) by mouth every 8 (eight) hours. 21 capsule Becky Augusta, NP   ipratropium (ATROVENT) 0.06 % nasal spray Place 2 sprays into both nostrils 4 (four) times daily. 15 mL Becky Augusta, NP   promethazine-dextromethorphan (PROMETHAZINE-DM) 6.25-15 MG/5ML syrup Take 5 mLs by mouth 4 (four) times daily as needed. 118 mL Becky Augusta, NP      PDMP not reviewed this encounter.   Becky Augusta, NP 11/04/23 1259

## 2023-11-09 ENCOUNTER — Emergency Department
Admission: EM | Admit: 2023-11-09 | Discharge: 2023-11-09 | Discharge status: Against Medical Advice | Payer: Medicare HMO | Attending: Emergency Medicine | Admitting: Emergency Medicine

## 2023-11-09 ENCOUNTER — Encounter: Payer: Self-pay | Admitting: Emergency Medicine

## 2023-11-09 ENCOUNTER — Other Ambulatory Visit: Payer: Self-pay

## 2023-11-09 DIAGNOSIS — R140 Abdominal distension (gaseous): Secondary | ICD-10-CM | POA: Diagnosis not present

## 2023-11-09 DIAGNOSIS — Z5321 Procedure and treatment not carried out due to patient leaving prior to being seen by health care provider: Secondary | ICD-10-CM | POA: Diagnosis not present

## 2023-11-09 DIAGNOSIS — R109 Unspecified abdominal pain: Secondary | ICD-10-CM | POA: Diagnosis present

## 2023-11-09 DIAGNOSIS — R197 Diarrhea, unspecified: Secondary | ICD-10-CM | POA: Diagnosis not present

## 2023-11-09 LAB — COMPREHENSIVE METABOLIC PANEL
ALT: 22 U/L (ref 0–44)
AST: 18 U/L (ref 15–41)
Albumin: 4 g/dL (ref 3.5–5.0)
Alkaline Phosphatase: 40 U/L (ref 38–126)
Anion gap: 5 (ref 5–15)
BUN: 9 mg/dL (ref 8–23)
CO2: 28 mmol/L (ref 22–32)
Calcium: 9.5 mg/dL (ref 8.9–10.3)
Chloride: 104 mmol/L (ref 98–111)
Creatinine, Ser: 0.86 mg/dL (ref 0.44–1.00)
GFR, Estimated: >60 mL/min (ref >60)
Glucose, Bld: 103 mg/dL — ABNORMAL HIGH (ref 70–99)
Potassium: 4 mmol/L (ref 3.5–5.1)
Sodium: 137 mmol/L (ref 135–145)
Total Bilirubin: 0.3 mg/dL (ref <1.2)
Total Protein: 7 g/dL (ref 6.5–8.1)

## 2023-11-09 LAB — CBC
HCT: 36.5 % (ref 36.0–46.0)
Hemoglobin: 12.1 g/dL (ref 12.0–15.0)
MCH: 30.3 pg (ref 26.0–34.0)
MCHC: 33.2 g/dL (ref 30.0–36.0)
MCV: 91.5 fL (ref 80.0–100.0)
Platelets: 384 10*3/uL (ref 150–400)
RBC: 3.99 MIL/uL (ref 3.87–5.11)
RDW: 12.4 % (ref 11.5–15.5)
WBC: 6.6 10*3/uL (ref 4.0–10.5)
nRBC: 0 % (ref 0.0–0.2)

## 2023-11-09 LAB — LIPASE, BLOOD: Lipase: 43 U/L (ref 11–51)

## 2023-11-09 NOTE — ED Triage Notes (Signed)
Patient to ED via [facility] for abd pain, bloating, and diarrhea. Started a few hours ago. Concerned for gastritis or states it could be her anxiety. States she has lost of wait due to only able to eat liquids- states food makes her bloat.

## 2024-04-21 ENCOUNTER — Ambulatory Visit
Admission: EM | Admit: 2024-04-21 | Discharge: 2024-04-21 | Disposition: A | Discharge status: Home / Self Care | Attending: Family Medicine | Admitting: Family Medicine

## 2024-04-21 DIAGNOSIS — R197 Diarrhea, unspecified: Secondary | ICD-10-CM | POA: Insufficient documentation

## 2024-04-21 DIAGNOSIS — B379 Candidiasis, unspecified: Secondary | ICD-10-CM | POA: Insufficient documentation

## 2024-04-21 DIAGNOSIS — R112 Nausea with vomiting, unspecified: Secondary | ICD-10-CM | POA: Insufficient documentation

## 2024-04-21 DIAGNOSIS — R10817 Generalized abdominal tenderness: Secondary | ICD-10-CM | POA: Insufficient documentation

## 2024-04-21 DIAGNOSIS — Z8719 Personal history of other diseases of the digestive system: Secondary | ICD-10-CM | POA: Diagnosis present

## 2024-04-21 LAB — COMPREHENSIVE METABOLIC PANEL WITH GFR
ALT: 17 U/L (ref 0–44)
AST: 16 U/L (ref 15–41)
Albumin: 4.1 g/dL (ref 3.5–5.0)
Alkaline Phosphatase: 65 U/L (ref 38–126)
Anion gap: 11 (ref 5–15)
BUN: 11 mg/dL (ref 8–23)
CO2: 26 mmol/L (ref 22–32)
Calcium: 9.4 mg/dL (ref 8.9–10.3)
Chloride: 100 mmol/L (ref 98–111)
Creatinine, Ser: 0.71 mg/dL (ref 0.44–1.00)
GFR, Estimated: >60 mL/min (ref >60)
Glucose, Bld: 98 mg/dL (ref 70–99)
Potassium: 4.1 mmol/L (ref 3.5–5.1)
Sodium: 137 mmol/L (ref 135–145)
Total Bilirubin: <0.2 mg/dL (ref 0.0–1.2)
Total Protein: 7.7 g/dL (ref 6.5–8.1)

## 2024-04-21 LAB — CBC WITH DIFFERENTIAL/PLATELET
Abs Immature Granulocytes: 0.03 10*3/uL (ref 0.00–0.07)
Basophils Absolute: 0.1 10*3/uL (ref 0.0–0.1)
Basophils Relative: 1 %
Eosinophils Absolute: 0.2 10*3/uL (ref 0.0–0.5)
Eosinophils Relative: 2 %
HCT: 38.9 % (ref 36.0–46.0)
Hemoglobin: 12.7 g/dL (ref 12.0–15.0)
Immature Granulocytes: 0 %
Lymphocytes Relative: 31 %
Lymphs Abs: 2.5 10*3/uL (ref 0.7–4.0)
MCH: 29.5 pg (ref 26.0–34.0)
MCHC: 32.6 g/dL (ref 30.0–36.0)
MCV: 90.5 fL (ref 80.0–100.0)
Monocytes Absolute: 0.7 10*3/uL (ref 0.1–1.0)
Monocytes Relative: 9 %
Neutro Abs: 4.4 10*3/uL (ref 1.7–7.7)
Neutrophils Relative %: 57 %
Platelets: 416 10*3/uL — ABNORMAL HIGH (ref 150–400)
RBC: 4.3 MIL/uL (ref 3.87–5.11)
RDW: 13.6 % (ref 11.5–15.5)
WBC: 7.9 10*3/uL (ref 4.0–10.5)
nRBC: 0 % (ref 0.0–0.2)

## 2024-04-21 LAB — URINALYSIS, W/ REFLEX TO CULTURE (INFECTION SUSPECTED)
Bilirubin Urine: NEGATIVE
Glucose, UA: NEGATIVE mg/dL
Hgb urine dipstick: NEGATIVE
Ketones, ur: NEGATIVE mg/dL
Nitrite: NEGATIVE
Protein, ur: NEGATIVE mg/dL
Specific Gravity, Urine: <1.005 — ABNORMAL LOW (ref 1.005–1.030)
pH: 5.5 (ref 5.0–8.0)

## 2024-04-21 LAB — LIPASE, BLOOD: Lipase: 43 U/L (ref 11–51)

## 2024-04-21 MED ORDER — CIPROFLOXACIN HCL 500 MG PO TABS: 500.0000 mg | ORAL_TABLET | Freq: Two times a day (BID) | ORAL | 0 refills | Status: AC

## 2024-04-21 MED ORDER — FLUCONAZOLE 150 MG PO TABS: 150.0000 mg | ORAL_TABLET | Freq: Once | ORAL | 0 refills | Status: AC

## 2024-04-21 NOTE — ED Provider Notes (Signed)
 [facility]    CSN: [id number] Arrival date & time: 04/21/24  1432      History   Chief Complaint Chief Complaint  Patient presents with   Emesis   Diarrhea    HPI Chloe Murray is a 78 y.o. female.   HPI  History obtained from the patient. Chloe Murray presents for vomiting and diarrhea. She took some antidiarrheal medication for a few days. She has pushed fluids. She takes a probiotic to help but still feels run down. Has has solid small stools now. No more diarrhea.   A month ago her grandsons were sick.  One of them was sick for 3 weeks and needed to go to the hospital. Two weeks ago her other grandson was vomiting "all over the place."  Her son and daughter in law caught it too.   She has had similar sx for 3 weeks. Had fever. She has been able to eat solid foods fort he past 2 days.  She wakes up in the morning and has a knot in stomach that goes away. Denies current abdominal pain. Taking Tylenol , Gas-Ex and antiacid pill.  She even vomited once and she "never vomits."  Has trouble swallowing pills from time to time.   She has been having urinary frequency. No dysuria or urinary urgency. Has been having nasal congestion and taking Flonase  but this is not new.       Past Medical History:  Diagnosis Date   Anxiety    Diverticulitis     There are no active problems to display for this patient.   History reviewed. No pertinent surgical history.  OB History   No obstetric history on file.      Home Medications    Prior to Admission medications   Medication Sig Start Date End Date Taking? Authorizing Provider  ciprofloxacin  (CIPRO ) 500 MG tablet Take 1 tablet (500 mg total) by mouth every 12 (twelve) hours. 04/21/24  Yes Laikynn Pollio, DO  acetaminophen  (TYLENOL ) 500 MG tablet Take by mouth.    [provider]  ALPRAZolam (XANAX) 0.5 MG tablet Take 0.5 mg by mouth 3 (three) times daily as needed. 08/16/21   [provider]   famotidine  (PEPCID ) 20 MG tablet Take 1 tablet (20 mg total) by mouth 2 (two) times daily. 01/18/19   Jacquie Maudlin, MD  ipratropium (ATROVENT ) 0.06 % nasal spray Place 2 sprays into both nostrils 4 (four) times daily. 11/04/23   Kent Pear, NP    Family History No family history on file.  Social History Social History   Tobacco Use   Smoking status: Former   Smokeless tobacco: Never  Advertising account planner   Vaping status: Never Used  Substance Use Topics   Alcohol use: Never   Drug use: Never     Allergies   Sertraline, Citalopram, and Penicillins   Review of Systems Review of Systems: negative unless otherwise stated in HPI.      Physical Exam Triage Vital Signs ED Triage Vitals  Encounter Vitals Group     BP 04/21/24 1506 (!) 153/75     Systolic BP Percentile --      Diastolic BP Percentile --      Pulse Rate 04/21/24 1506 70     Resp 04/21/24 1506 16     Temp 04/21/24 1506 97.8 F (36.6 C)     Temp Source 04/21/24 1506 Oral     SpO2 04/21/24 1506 98 %     Weight --  Height --      Head Circumference --      Peak Flow --      Pain Score 04/21/24 1503 0     Pain Loc --      Pain Education --      Exclude from Growth Chart --    No data found.  Updated Vital Signs BP (!) 153/75 (BP Location: Right Arm)   Pulse 70   Temp 97.8 F (36.6 C) (Oral)   Resp 16   SpO2 98%   Visual Acuity Right Eye Distance:   Left Eye Distance:   Bilateral Distance:    Right Eye Near:   Left Eye Near:    Bilateral Near:     Physical Exam GEN:     alert, non-toxic appearing female in no distress    HENT:  mucus membranes moist, oropharyngeal without lesions or erythema, no tonsillar hypertrophy or exudates, no asal discharge, EYES:   no scleral injection , icterus or discharge NECK:  normal ROM, no lymphadenopathy, no meningismus   RESP:  no increased work of breathing, clear to auscultation bilaterally CVS:   regular rate and rhythm ABD:   Soft, left sided TTP,   nondistended, no guarding, no rebound, active bowel sounds throughout, negative McBurney's, negative Murphy's Skin:   warm and dry    UC Treatments / Results  Labs (all labs ordered are listed, but only abnormal results are displayed) Labs Reviewed  URINALYSIS, W/ REFLEX TO CULTURE (INFECTION SUSPECTED) - Abnormal; Notable for the following components:      Result Value   Specific Gravity, Urine <1.005 (*)    Leukocytes,Ua TRACE (*)    Bacteria, UA RARE (*)    All other components within normal limits  CBC WITH DIFFERENTIAL/PLATELET - Abnormal; Notable for the following components:   Platelets 416 (*)    All other components within normal limits  URINE CULTURE  COMPREHENSIVE METABOLIC PANEL WITH GFR  LIPASE, BLOOD    EKG   Radiology No results found.  Procedures Procedures (including critical care time)  Medications Ordered in UC Medications - No data to display  Initial Impression / Assessment and Plan / UC Course  I have reviewed the triage vital signs and the nursing notes.  Pertinent labs & imaging results that were available during my care of the patient were reviewed by me and considered in my medical decision making (see chart for details).       Pt is a 78 y.o. female who presents for generalized weakness, diarrhea and vomiting with abdominal pain for the past 2-3 weeks. Vallerie is afebrile here without recent antipyretics. Satting well on room air. Overall pt is  non-toxic appearing, well hydrated, without respiratory distress.  Exam not concerning for acute abdomen however she does have some left-sided abdominal tenderness without guarding or rebound.   Diarrhea has resolved therefore GI pathogen panel deferred.  Lipase not concerning for acute pancreatitis.  CMP showing no electrolyte, liver or kidney function abnormalities.  CBC with mild thrombocytosis.  Discussed getting a urinalysis as she is having urinary frequency.  Urinalysis showing evidence of  possible acute cystitis.  Urine culture ordered.  Follow-up sensitivities and stop/change antibiotics if needed.  Treat with ciprofloxacin  twice daily for 7 days as this will also treat possible concurrent diverticulitis.  There was some yeast seen in her urine as well.  Diflucan sent to the pharmacy.  Return and ED precautions given and voiced understanding. Discussed MDM, treatment plan and plan  for follow-up with patient who agrees with plan.     Final Clinical Impressions(s) / UC Diagnoses   Final diagnoses:  Yeast infection  Generalized abdominal tenderness without rebound tenderness  History of diverticulitis  Nausea vomiting and diarrhea     Discharge Instructions      Your urine showed possible urinary tract infection.  I sent a urine culture to be sure.  If positive, someone will call you to stop or change antibiotics.  Your urine did show a yeast infection.  I sent a tablet of Diflucan to your pharmacy.  Stop by the pharmacy and pick up this medication.   Your kidneys, liver and pancreas are functioning properly.  Your electrolytes are normal.  You do not have elevated markers for infection or inflammation.     ED Prescriptions     Medication Sig Dispense Auth. Provider   fluconazole (DIFLUCAN) 150 MG tablet Take 1 tablet (150 mg total) by mouth once for 1 dose. 1 tablet Raniya Golembeski, DO   ciprofloxacin  (CIPRO ) 500 MG tablet Take 1 tablet (500 mg total) by mouth every 12 (twelve) hours. 14 tablet Venecia Mehl, DO      PDMP not reviewed this encounter.   Caylin Nass, DO 04/22/24 1223

## 2024-04-21 NOTE — ED Triage Notes (Signed)
 Her family came over and they were sick with vomiting and diarrhea.   Vomiting and diarrhea Fatigue  Taking OTC medications She has been able to keep in some food but still not feeling "great"  Stomach feels like in a knot   Primary doctor is  Dr. Dawne Euler at [facility]

## 2024-04-21 NOTE — Discharge Instructions (Addendum)
 Your urine showed possible urinary tract infection.  I sent a urine culture to be sure.  If positive, someone will call you to stop or change antibiotics.  Your urine did show a yeast infection.  I sent a tablet of Diflucan to your pharmacy.  Stop by the pharmacy and pick up this medication.   Your kidneys, liver and pancreas are functioning properly.  Your electrolytes are normal.  You do not have elevated markers for infection or inflammation.

## 2024-04-23 LAB — URINE CULTURE: Culture: NO GROWTH

## 2024-04-25 ENCOUNTER — Emergency Department
Admission: EM | Admit: 2024-04-25 | Discharge: 2024-04-25 | Disposition: A | Discharge status: Home / Self Care | Attending: Emergency Medicine | Admitting: Emergency Medicine

## 2024-04-25 ENCOUNTER — Other Ambulatory Visit: Payer: Self-pay

## 2024-04-25 DIAGNOSIS — R42 Dizziness and giddiness: Secondary | ICD-10-CM | POA: Insufficient documentation

## 2024-04-25 DIAGNOSIS — R531 Weakness: Secondary | ICD-10-CM | POA: Insufficient documentation

## 2024-04-25 HISTORY — DX: Urinary tract infection, site not specified: N39.0

## 2024-04-25 LAB — COMPREHENSIVE METABOLIC PANEL WITH GFR
ALT: 17 U/L (ref 0–44)
AST: 19 U/L (ref 15–41)
Albumin: 3.8 g/dL (ref 3.5–5.0)
Alkaline Phosphatase: 55 U/L (ref 38–126)
Anion gap: 8 (ref 5–15)
BUN: 9 mg/dL (ref 8–23)
CO2: 28 mmol/L (ref 22–32)
Calcium: 9.3 mg/dL (ref 8.9–10.3)
Chloride: 101 mmol/L (ref 98–111)
Creatinine, Ser: 0.81 mg/dL (ref 0.44–1.00)
GFR, Estimated: >60 mL/min (ref >60)
Glucose, Bld: 99 mg/dL (ref 70–99)
Potassium: 3.8 mmol/L (ref 3.5–5.1)
Sodium: 137 mmol/L (ref 135–145)
Total Bilirubin: 0.5 mg/dL (ref 0.0–1.2)
Total Protein: 6.7 g/dL (ref 6.5–8.1)

## 2024-04-25 LAB — URINALYSIS, ROUTINE W REFLEX MICROSCOPIC
Bilirubin Urine: NEGATIVE
Glucose, UA: NEGATIVE mg/dL
Hgb urine dipstick: NEGATIVE
Ketones, ur: NEGATIVE mg/dL
Leukocytes,Ua: NEGATIVE
Nitrite: NEGATIVE
Protein, ur: NEGATIVE mg/dL
Specific Gravity, Urine: 1.003 — ABNORMAL LOW (ref 1.005–1.030)
pH: 6 (ref 5.0–8.0)

## 2024-04-25 LAB — CBC
HCT: 37.3 % (ref 36.0–46.0)
Hemoglobin: 11.8 g/dL — ABNORMAL LOW (ref 12.0–15.0)
MCH: 29.1 pg (ref 26.0–34.0)
MCHC: 31.6 g/dL (ref 30.0–36.0)
MCV: 92.1 fL (ref 80.0–100.0)
Platelets: 392 10*3/uL (ref 150–400)
RBC: 4.05 MIL/uL (ref 3.87–5.11)
RDW: 13 % (ref 11.5–15.5)
WBC: 5.9 10*3/uL (ref 4.0–10.5)
nRBC: 0 % (ref 0.0–0.2)

## 2024-04-25 LAB — CBG MONITORING, ED: Glucose-Capillary: 103 mg/dL — ABNORMAL HIGH (ref 70–99)

## 2024-04-25 NOTE — ED Triage Notes (Signed)
 Pt to ED POV because states is having reaction to zithromax which was prescribed at [facility] on Saturday for UTI. Pt states "every time I take zithro I feel weak and dizzy". Pt denies dizziness at this time but states she is weak. Hard to get CC and story from pt--long winded. Pt denies N/V at this time.

## 2024-04-25 NOTE — Discharge Instructions (Signed)
 Discontinue the ciprofloxacin .  Make sure to drink plenty of fluids.  Make an appointment to follow-up with your primary care provider.  In the meantime, return to the ER immediately for new, worsening, or persistent severe dizziness, weakness, abdominal pain, vomiting, fever, urinary symptoms, or any other new or worsening symptoms that concern you.

## 2024-04-25 NOTE — ED Provider Notes (Signed)
 [facility] Provider Note    Event Date/Time   First MD Initiated Contact with Patient 04/25/24 1308     (approximate)   History   Weakness   HPI  Chloe Murray is a 78 y.o. female with history of diverticulitis and anxiety who presents with dizziness over the last several days, described as feeling lightheaded.  The patient also feels somewhat generally weak.  She denies any vomiting or diarrhea.  She denies any abdominal pain.  She states that she was started on azithromycin for a urinary tract infection several days ago and believes that her symptoms are reaction to the medication.  She reports some urinary frequency but denies other urinary symptoms.  She states that she last took the medication this morning.  I reviewed the past medical records.  The patient was seen in [facility] on 5/3.  She was diagnosed with UTI, although the urinalysis only showed rare bacteria and trace leukocyte esterase.  Urine ultra shows no growth.  The patient was started on Cipro , not azithromycin.   Physical Exam   Triage Vital Signs: ED Triage Vitals  Encounter Vitals Group     BP 04/25/24 1120 (!) 116/57     Systolic BP Percentile --      Diastolic BP Percentile --      Pulse Rate 04/25/24 1120 85     Resp 04/25/24 1120 20     Temp 04/25/24 1120 98.7 F (37.1 C)     Temp Source 04/25/24 1120 Oral     SpO2 04/25/24 1120 93 %     Weight 04/25/24 1121 102 lb (46.3 kg)     Height 04/25/24 1121 5\' 3"  (1.6 m)     Head Circumference --      Peak Flow --      Pain Score 04/25/24 1119 4     Pain Loc --      Pain Education --      Exclude from Growth Chart --     Most recent vital signs: Vitals:   04/25/24 1120 04/25/24 1513  BP: (!) 116/57 (!) 118/58  Pulse: 85 77  Resp: 20 18  Temp: 98.7 F (37.1 C)   SpO2: 93% 95%     General: Alert and oriented, well-appearing, no distress.  CV:  Good peripheral perfusion.  Resp:  Normal effort.  Abd:  No  distention.  Mild left lower quadrant discomfort to palpation. Other:  No jaundice or scleral icterus.  Slightly dry mucous membranes.  EOMI.  PERRLA.  No facial droop.  Normal speech.  Motor intact in all extremities.  Normal gait.   ED Results / Procedures / Treatments   Labs (all labs ordered are listed, but only abnormal results are displayed) Labs Reviewed  CBC - Abnormal; Notable for the following components:      Result Value   Hemoglobin 11.8 (*)    All other components within normal limits  URINALYSIS, ROUTINE W REFLEX MICROSCOPIC - Abnormal; Notable for the following components:   Color, Urine COLORLESS (*)    APPearance CLEAR (*)    Specific Gravity, Urine 1.003 (*)    All other components within normal limits  CBG MONITORING, ED - Abnormal; Notable for the following components:   Glucose-Capillary 103 (*)    All other components within normal limits  COMPREHENSIVE METABOLIC PANEL WITH GFR     EKG  ED ECG REPORT I, Lind Repine, the attending physician, personally viewed and interpreted this ECG.  Date: 04/25/2024 EKG Time: 1125 Rate: 80 Rhythm: normal sinus rhythm QRS Axis: normal Intervals: normal ST/T Wave abnormalities: normal Narrative Interpretation: no evidence of acute ischemia    RADIOLOGY   PROCEDURES:  Critical Care performed: No  Procedures   MEDICATIONS ORDERED IN ED: Medications - No data to display   IMPRESSION / MDM / ASSESSMENT AND PLAN / ED COURSE  I reviewed the triage vital signs and the nursing notes.  78 year old female with PMH as noted above presents with weakness and dizziness after being started on Cipro  for an apparent UTI several days ago.  However on review of those records, the urinalysis was equivocal and the culture has come back negative.  On exam patient is very well-appearing.  Her vital signs are normal.  Abdomen is soft with mild left lower quadrant discomfort but the patient has no active pain when it is  not being palpated.  Differential diagnosis includes, but is not limited to, dehydration, electrolyte abnormality, UTI, medication side effect.  There is no evidence of cardiac etiology.  There is no evidence of active diverticulitis.  The patient states she has not taken Cipro  before and does not know if she has any adverse reactions to it, but reports adverse reactions to multiple medications.  Patient's presentation is most consistent with acute complicated illness / injury requiring diagnostic workup.  Lab workup is reassuring.  CMP shows no acute abnormality.  CBC shows no leukocytosis or anemia.  Urinalysis is negative.  We checked orthostatics.  The patient had a mild blood pressure drop with standing that resolved after couple minutes.  She was not significantly symptomatic.   I discussed the results of the workup and the patient's symptoms with the patient.  At this time clinically there is no strong evidence for active diverticulitis or other acute infection.  Given the mild change in her blood pressure with standing, I discussed the possibility of giving fluids although the patient is tolerating p.o.  She states she is feeling fine and would prefer to go home.  I advised that she should discontinue the ciprofloxacin  and follow-up with her primary care doctor.  She is in agreement with this plan.  I gave strict return precautions that she expressed understanding.  FINAL CLINICAL IMPRESSION(S) / ED DIAGNOSES   Final diagnoses:  Dizziness     Rx / DC Orders   ED Discharge Orders     None        Note:  This document was prepared using Dragon voice recognition software and may include unintentional dictation errors.    Lind Repine, MD 04/25/24 1526

## 2024-04-25 NOTE — ED Notes (Signed)
 Pt verbalizes understanding of discharge instructions. Opportunity for questioning and answers were provided. Pt discharged from ED to home with family. Pt sat in wheelchair in lobby to wait on family who will arrive in 15 minutes. Chair alarm on and first nurse notified.

## 2024-09-22 ENCOUNTER — Other Ambulatory Visit: Payer: Self-pay

## 2024-09-22 ENCOUNTER — Emergency Department
Admission: EM | Admit: 2024-09-22 | Discharge: 2024-09-22 | Disposition: A | Discharge status: Home / Self Care | Attending: Emergency Medicine | Admitting: Emergency Medicine

## 2024-09-22 DIAGNOSIS — T63461A Toxic effect of venom of wasps, accidental (unintentional), initial encounter: Secondary | ICD-10-CM | POA: Diagnosis present

## 2024-09-22 DIAGNOSIS — T7840XA Allergy, unspecified, initial encounter: Secondary | ICD-10-CM | POA: Diagnosis not present

## 2024-09-22 MED ORDER — DEXAMETHASONE SODIUM PHOSPHATE 10 MG/ML IJ SOLN
10.0000 mg | Freq: Once | INTRAMUSCULAR | Status: AC
  Administered 2024-09-22: 10 mg via INTRAMUSCULAR
  Filled 2024-09-22: qty 1

## 2024-09-22 MED ORDER — PREDNISONE 10 MG PO TABS: 10.0000 mg | ORAL_TABLET | Freq: Every day | ORAL | 0 refills | Status: AC

## 2024-09-22 NOTE — ED Provider Notes (Signed)
 [facility] Provider Note   CSN: [id number] Arrival date & time: 09/22/24  9276     Patient presents with: Insect Bite   Chloe Murray is a 78 y.o. female.  Presents to the emergency department for evaluation of a wasp sting to her right hand and left elbow.  Stings occurred last night.  She has not taken any medications for her symptoms.  She has moderate swelling along the dorsum of the right hand as well as medial aspect of the left elbow.  Facial swelling, difficulty swallowing, chest tightness, wheezing or difficulty breathing.  Swelling is localized to the sites only with no other rashes.  She describes itching.  No fevers.  No history of anaphylaxis.  She is not diabetic.       Prior to Admission medications   Medication Sig Start Date End Date Taking? Authorizing Provider  predniSONE (DELTASONE) 10 MG tablet Take 1 tablet (10 mg total) by mouth daily. 6,5,4,3,2,1 six day taper 09/22/24  Yes Charlene Debby BROCKS, PA-C  acetaminophen  (TYLENOL ) 500 MG tablet Take by mouth.    [provider]  ALPRAZolam (XANAX) 0.5 MG tablet Take 0.5 mg by mouth 3 (three) times daily as needed. 08/16/21   [provider]  ciprofloxacin  (CIPRO ) 500 MG tablet Take 1 tablet (500 mg total) by mouth every 12 (twelve) hours. 04/21/24   Brimage, Vondra, DO  famotidine  (PEPCID ) 20 MG tablet Take 1 tablet (20 mg total) by mouth 2 (two) times daily. 01/18/19   Viviann Pastor, MD  ipratropium (ATROVENT ) 0.06 % nasal spray Place 2 sprays into both nostrils 4 (four) times daily. 11/04/23   Bernardino Ditch, NP    Allergies: Sertraline, Citalopram, and Penicillins    Review of Systems  Updated Vital Signs BP 127/60 (BP Location: Left Arm)   Pulse 83   Temp 97.6 F (36.4 C) (Oral)   Resp 20   Ht 5' 3 (1.6 m)   Wt 43.5 kg   SpO2 97%   BMI 17.01 kg/m   Physical Exam Constitutional:      Appearance: She is well-developed.  HENT:     Head:  Normocephalic and atraumatic.  Eyes:     Conjunctiva/sclera: Conjunctivae normal.  Cardiovascular:     Rate and Rhythm: Normal rate.  Pulmonary:     Effort: Pulmonary effort is normal. No respiratory distress.  Musculoskeletal:        General: Normal range of motion.     Cervical back: Normal range of motion.  Skin:    General: Skin is warm.     Findings: Erythema present. No rash.     Comments: Dorsum of the right hand shows mild to moderate erythema, swelling with slight warmth.  Wash sting sites are slightly raised and erythematous.  No bullous lesions.  Sensation is intact.  Compartments are soft.  2+ radial pulse and 2+ cap refill.  She can perform full composite fist.   Left elbow with mild to moderate swelling medial aspect, focal area of swelling, redness and warmth.  Normal range of motion of the left elbow.  No signs of joint effusion.  Neuro vas intact in left upper extremity.  Compartments are soft.  Neurological:     Mental Status: She is alert and oriented to person, place, and time.  Psychiatric:        Behavior: Behavior normal.        Thought Content: Thought content normal.     (all labs ordered  are listed, but only abnormal results are displayed) Labs Reviewed - No data to display  EKG: None  Radiology: No results found.   Procedures   Medications Ordered in the ED  dexamethasone (DECADRON) injection 10 mg (has no administration in time range)                                    Medical Decision Making Risk Prescription drug management.   78 year old female with wasp sting to her right hand, left elbow.  Stings occurred around 5 PM yesterday.  She has not take any medications for symptoms.  She has moderate swelling and localized reaction to the sting sites.  No history of anaphylaxis.  No signs of anaphylaxis today, she has no facial pain, swelling, systemic rash, shortness of breath, wheezing or difficulty swallowing.  Her vital signs are stable  with normal blood pressure and heart rate.  She appears well.  Localized swelling is moderate, will give 10 mg of dexamethasone IM today.  Encouraged her to take daytime antihistamine medication and apply topical cortisone creams to the areas.  Will give prednisone taper as well to help with remainder of swelling and redness.  She is given strict return precautions understands signs symptoms return to the ER for. Final diagnoses:  Wasp sting, accidental or unintentional, initial encounter  Allergic reaction, initial encounter    ED Discharge Orders          Ordered    predniSONE (DELTASONE) 10 MG tablet  Daily        09/22/24 0742               Arbie Blankley C, PA-C 09/22/24 9251    Arlander Charleston, MD 09/22/24 [phone]

## 2024-09-22 NOTE — Discharge Instructions (Signed)
 You may take Claritin or Zyrtec daily to help with itching.  Take prednisone as prescribed.  You may apply topical hydrocortisone cream to areas of inflammation.  Return to the ER if you develop any shortness of breath facial swelling difficulty swallowing or any urgent changes in your health

## 2024-09-22 NOTE — ED Triage Notes (Signed)
 Pt states she was stung by yellow jackets yesterday. Right arm is swollen & painful
